# Patient Record
Sex: Female | Born: 1970 | Hispanic: No | Marital: Single | Smoking: Current every day smoker
Health system: Southern US, Community
[De-identification: ages and names within clinical notes are randomized; demographics above are authoritative.]

## PROBLEM LIST (undated history)

## (undated) ENCOUNTER — Inpatient Hospital Stay (HOSPITAL_COMMUNITY): Payer: Self-pay

## (undated) DIAGNOSIS — O039 Complete or unspecified spontaneous abortion without complication: Secondary | ICD-10-CM

## (undated) DIAGNOSIS — Z8489 Family history of other specified conditions: Secondary | ICD-10-CM

## (undated) DIAGNOSIS — Z789 Other specified health status: Secondary | ICD-10-CM

## (undated) HISTORY — DX: Complete or unspecified spontaneous abortion without complication: O03.9

## (undated) HISTORY — PX: DILATION AND CURETTAGE OF UTERUS: SHX78

---

## 2000-08-02 ENCOUNTER — Other Ambulatory Visit: Admission: RE | Admit: 2000-08-02 | Discharge: 2000-08-02 | Payer: Self-pay | Admitting: Gynecology

## 2001-03-20 ENCOUNTER — Other Ambulatory Visit: Admission: RE | Admit: 2001-03-20 | Discharge: 2001-03-20 | Payer: Self-pay | Admitting: Obstetrics and Gynecology

## 2001-09-20 ENCOUNTER — Encounter: Admission: RE | Admit: 2001-09-20 | Discharge: 2001-09-20 | Payer: Self-pay | Admitting: Obstetrics and Gynecology

## 2001-10-01 ENCOUNTER — Inpatient Hospital Stay (HOSPITAL_COMMUNITY): Admission: AD | Admit: 2001-10-01 | Discharge: 2001-10-04 | Payer: Self-pay | Admitting: Obstetrics and Gynecology

## 2005-08-08 ENCOUNTER — Other Ambulatory Visit: Admission: RE | Admit: 2005-08-08 | Discharge: 2005-08-08 | Payer: Self-pay | Admitting: Obstetrics & Gynecology

## 2012-12-13 ENCOUNTER — Telehealth: Payer: Self-pay | Admitting: Family Medicine

## 2012-12-13 ENCOUNTER — Encounter: Payer: Self-pay | Admitting: Obstetrics and Gynecology

## 2012-12-13 NOTE — Telephone Encounter (Signed)
Called patient about her appointment being changed, but her phone has been turned off.

## 2013-01-02 ENCOUNTER — Encounter: Payer: Self-pay | Admitting: Family Medicine

## 2013-12-09 ENCOUNTER — Encounter (HOSPITAL_COMMUNITY): Payer: Self-pay | Admitting: Emergency Medicine

## 2013-12-09 DIAGNOSIS — Z87891 Personal history of nicotine dependence: Secondary | ICD-10-CM | POA: Insufficient documentation

## 2013-12-09 DIAGNOSIS — O9989 Other specified diseases and conditions complicating pregnancy, childbirth and the puerperium: Secondary | ICD-10-CM | POA: Insufficient documentation

## 2013-12-09 DIAGNOSIS — R109 Unspecified abdominal pain: Secondary | ICD-10-CM | POA: Diagnosis present

## 2013-12-09 LAB — CBC
HEMATOCRIT: 39.6 % (ref 36.0–46.0)
Hemoglobin: 13.3 g/dL (ref 12.0–15.0)
MCH: 31.7 pg (ref 26.0–34.0)
MCHC: 33.6 g/dL (ref 30.0–36.0)
MCV: 94.3 fL (ref 78.0–100.0)
PLATELETS: 256 10*3/uL (ref 150–400)
RBC: 4.2 MIL/uL (ref 3.87–5.11)
RDW: 13.2 % (ref 11.5–15.5)
WBC: 8.3 10*3/uL (ref 4.0–10.5)

## 2013-12-09 LAB — ABO/RH: ABO/RH(D): O POS

## 2013-12-09 LAB — HCG, QUANTITATIVE, PREGNANCY: hCG, Beta Chain, Quant, S: 2833 m[IU]/mL — ABNORMAL HIGH (ref <5)

## 2013-12-09 NOTE — ED Notes (Signed)
Pt reports constant 8/10 "sharp" lower abdominal pain starting at 1000 today. States she is [redacted] weeks pregnant. Pt denies vaginal bleeding/discharge. Denies N/V/D. Pt denies urinary symptoms. NAD. AO x4.

## 2013-12-10 ENCOUNTER — Emergency Department (HOSPITAL_COMMUNITY): Payer: Medicaid Other

## 2013-12-10 ENCOUNTER — Emergency Department (HOSPITAL_COMMUNITY)
Admission: EM | Admit: 2013-12-10 | Discharge: 2013-12-10 | Disposition: A | Discharge status: Home / Self Care | Payer: Medicaid Other | Attending: Emergency Medicine | Admitting: Emergency Medicine

## 2013-12-10 DIAGNOSIS — R102 Pelvic and perineal pain: Secondary | ICD-10-CM

## 2013-12-10 DIAGNOSIS — O26899 Other specified pregnancy related conditions, unspecified trimester: Secondary | ICD-10-CM

## 2013-12-10 DIAGNOSIS — Z3490 Encounter for supervision of normal pregnancy, unspecified, unspecified trimester: Secondary | ICD-10-CM

## 2013-12-10 LAB — URINALYSIS, ROUTINE W REFLEX MICROSCOPIC
BILIRUBIN URINE: NEGATIVE
Glucose, UA: NEGATIVE mg/dL
Hgb urine dipstick: NEGATIVE
KETONES UR: NEGATIVE mg/dL
Leukocytes, UA: NEGATIVE
NITRITE: NEGATIVE
PH: 5.5 (ref 5.0–8.0)
Protein, ur: NEGATIVE mg/dL
Specific Gravity, Urine: 1.029 (ref 1.005–1.030)
Urobilinogen, UA: 0.2 mg/dL (ref 0.0–1.0)

## 2013-12-10 LAB — RPR

## 2013-12-10 LAB — WET PREP, GENITAL
CLUE CELLS WET PREP: NONE SEEN
TRICH WET PREP: NONE SEEN
YEAST WET PREP: NONE SEEN

## 2013-12-10 LAB — POC URINE PREG, ED: PREG TEST UR: POSITIVE — AB

## 2013-12-10 NOTE — Discharge Instructions (Signed)
Your testing shows a normal pregnancy around [redacted] weeks pregnant - you MUST follow up for repeat ultrasound and blood test in 2 weeks - see the phone number above.  Tylenol for pain - do not take motrin or other over the counter medicines.

## 2013-12-10 NOTE — ED Provider Notes (Signed)
CSN: [id number]     Arrival date & time 12/09/13  2211 History   First MD Initiated Contact with Patient 12/10/13 [phone]     Chief Complaint  Patient presents with  . Abdominal Pain     (Consider location/radiation/quality/duration/timing/severity/associated sxs/prior Treatment) HPI Comments: The patient is a 43 year old female who presents with a complaint of left pelvic pain. This started abruptly at 6:00 this evening, it has been persistent, worse with palpation of the abdomen and not associated with nausea vomiting vaginal discharge or bleeding or dysuria or constipation or diarrhea. She reports that she is [redacted] weeks pregnant by dates, she has had a pregnancy test at her doctor's office but has not seen a gynecologist. She has had 2 other pregnancies, both of them were uncomplicated term deliveries. She denies any other abdominal surgery, medical problems or allergies. She describes this pain as a sharp pain, no radiation to the back  Patient is a 43 y.o. female presenting with abdominal pain. The history is provided by the patient.  Abdominal Pain   History reviewed. No pertinent past medical history. History reviewed. No pertinent past surgical history. No family history on file. History  Substance Use Topics  . Smoking status: Former Games developer  . Smokeless tobacco: Not on file  . Alcohol Use: No   OB History   Grav Para Term Preterm Abortions TAB SAB Ect Mult Living   3 2 2   0  0 0  2     Review of Systems  Gastrointestinal: Positive for abdominal pain.  All other systems reviewed and are negative.     Allergies  Review of patient's allergies indicates no known allergies.  Home Medications   Prior to Admission medications   Not on File   BP 118/67  Pulse 63  Temp(Src) 98.1 F (36.7 C) (Oral)  Resp 18  Wt 127 lb (57.607 kg)  SpO2 100%  LMP 10/31/2013 Physical Exam  Nursing note and vitals reviewed. Constitutional: She appears well-developed and well-nourished. No  distress.  HENT:  Head: Normocephalic and atraumatic.  Mouth/Throat: Oropharynx is clear and moist. No oropharyngeal exudate.  Eyes: Conjunctivae and EOM are normal. Pupils are equal, round, and reactive to light. Right eye exhibits no discharge. Left eye exhibits no discharge. No scleral icterus.  Neck: Normal range of motion. Neck supple. No JVD present. No thyromegaly present.  Cardiovascular: Normal rate, regular rhythm, normal heart sounds and intact distal pulses.  Exam reveals no gallop and no friction rub.   No murmur heard. Pulmonary/Chest: Effort normal and breath sounds normal. No respiratory distress. She has no wheezes. She has no rales.  Abdominal: Soft. Bowel sounds are normal. She exhibits no distension and no mass. There is tenderness ( Left lower quadrant and pelvic tenderness to palpation over the abdominal wall, no other tenderness).  Genitourinary:  Is a Chaperone present for exam: No tenderness to palpation, cervical os closed, no discharge, no masses, normal external genitalia  Musculoskeletal: Normal range of motion. She exhibits no edema and no tenderness.  Lymphadenopathy:    She has no cervical adenopathy.  Neurological: She is alert. Coordination normal.  Skin: Skin is warm and dry. No rash noted. No erythema.  Psychiatric: She has a normal mood and affect. Her behavior is normal.    ED Course  Procedures (including critical care time) Labs Review Labs Reviewed  WET PREP, GENITAL - Abnormal; Notable for the following:    WBC, Wet Prep HPF POC FEW (*)    All  other components within normal limits  HCG, QUANTITATIVE, PREGNANCY - Abnormal; Notable for the following:    hCG, Beta Chain, Quant, S 2833 (*)    All other components within normal limits  URINALYSIS, ROUTINE W REFLEX MICROSCOPIC - Abnormal; Notable for the following:    APPearance CLOUDY (*)    All other components within normal limits  POC URINE PREG, ED - Abnormal; Notable for the following:     Preg Test, Ur POSITIVE (*)    All other components within normal limits  GC/CHLAMYDIA PROBE AMP  CBC  RPR  HIV ANTIBODY (ROUTINE TESTING)  ABO/RH    Imaging Review [place] Ob Comp Less 14 Wks  12/10/2013   CLINICAL DATA:  Pain and pregnancy.  Evaluate for ectopic  EXAM: OBSTETRIC <14 WK US AND TRANSVAGINAL OB US  TECHNIQUE: Both transabdominal and transvaginal ultrasound examinations were performed for complete evaluation of the gestation as well as the maternal uterus, adnexal regions, and pelvic cul-de-sac. Transvaginal technique was performed to assess early pregnancy.  COMPARISON:  None.  FINDINGS: Intrauterine gestational sac: Visualized/normal in shape.  Yolk sac:  Visualized  Embryo:  Not yet visualized  MSD:  6.8  mm   5 w   3  d              US EDC: 08/09/2014  Maternal uterus/adnexae: There is a 2.2 cm cyst within the right ovary which is likely follicular. No adnexal mass. Trace (2 mm) subchorionic fluid/hemorrhage.  IMPRESSION: Single intrauterine gestation, estimated age 4 weeks 3 days. As expected for gestational age, a fetus is not yet visible; follow-up quantitative B-HCG levels and US in 14 days could confirm viability.   Electronically Signed   By: Tiburcio PeaJonathan  Watts M.D.   On: 12/10/2013 03:04   [place] Ob Transvaginal  12/10/2013   CLINICAL DATA:  Pain and pregnancy.  Evaluate for ectopic  EXAM: OBSTETRIC <14 WK US AND TRANSVAGINAL OB US  TECHNIQUE: Both transabdominal and transvaginal ultrasound examinations were performed for complete evaluation of the gestation as well as the maternal uterus, adnexal regions, and pelvic cul-de-sac. Transvaginal technique was performed to assess early pregnancy.  COMPARISON:  None.  FINDINGS: Intrauterine gestational sac: Visualized/normal in shape.  Yolk sac:  Visualized  Embryo:  Not yet visualized  MSD:  6.8  mm   5 w   3  d              US EDC: 08/09/2014  Maternal uterus/adnexae: There is a 2.2 cm cyst within the right ovary which is likely follicular. No  adnexal mass. Trace (2 mm) subchorionic fluid/hemorrhage.  IMPRESSION: Single intrauterine gestation, estimated age 4 weeks 3 days. As expected for gestational age, a fetus is not yet visible; follow-up quantitative B-HCG levels and US in 14 days could confirm viability.   Electronically Signed   By: Tiburcio PeaJonathan  Watts M.D.   On: 12/10/2013 03:04      MDM   Final diagnoses:  Pregnancy  Pelvic pain affecting pregnancy    The patient has no tenderness, she is pregnant, rule out ectopic or infection or urinary tract infection. Vital signs normal, hemodynamically stable.  US with IUP at [redacted] weeks gestation, all other labs wnl, pt stable, informed, GYN f/u given.  Vida RollerBrian D Tarry Blayney, MD 12/10/13 [phone]

## 2013-12-10 NOTE — ED Notes (Signed)
To ultrasound

## 2013-12-10 NOTE — ED Notes (Signed)
Patient returned from ultrasound.

## 2013-12-11 LAB — GC/CHLAMYDIA PROBE AMP
CT Probe RNA: NEGATIVE
GC PROBE AMP APTIMA: NEGATIVE

## 2013-12-11 LAB — HIV ANTIBODY (ROUTINE TESTING W REFLEX): HIV 1&2 Ab, 4th Generation: NONREACTIVE

## 2014-01-14 ENCOUNTER — Encounter (HOSPITAL_COMMUNITY): Payer: Self-pay | Admitting: *Deleted

## 2014-01-14 ENCOUNTER — Inpatient Hospital Stay (HOSPITAL_COMMUNITY): Payer: Medicaid Other

## 2014-01-14 ENCOUNTER — Inpatient Hospital Stay (HOSPITAL_COMMUNITY)
Admission: AD | Admit: 2014-01-14 | Discharge: 2014-01-14 | Disposition: A | Discharge status: Home / Self Care | Payer: Medicaid Other | Source: Ambulatory Visit | Attending: Obstetrics & Gynecology | Admitting: Obstetrics & Gynecology

## 2014-01-14 DIAGNOSIS — O034 Incomplete spontaneous abortion without complication: Secondary | ICD-10-CM | POA: Diagnosis not present

## 2014-01-14 DIAGNOSIS — Z87891 Personal history of nicotine dependence: Secondary | ICD-10-CM | POA: Insufficient documentation

## 2014-01-14 DIAGNOSIS — R109 Unspecified abdominal pain: Secondary | ICD-10-CM | POA: Diagnosis present

## 2014-01-14 HISTORY — DX: Other specified health status: Z78.9

## 2014-01-14 LAB — POCT PREGNANCY, URINE: Preg Test, Ur: POSITIVE — AB

## 2014-01-14 LAB — CBC
HEMATOCRIT: 40.7 % (ref 36.0–46.0)
Hemoglobin: 14.1 g/dL (ref 12.0–15.0)
MCH: 32.6 pg (ref 26.0–34.0)
MCHC: 34.6 g/dL (ref 30.0–36.0)
MCV: 94.2 fL (ref 78.0–100.0)
Platelets: 213 10*3/uL (ref 150–400)
RBC: 4.32 MIL/uL (ref 3.87–5.11)
RDW: 13.2 % (ref 11.5–15.5)
WBC: 10.1 10*3/uL (ref 4.0–10.5)

## 2014-01-14 LAB — URINE MICROSCOPIC-ADD ON

## 2014-01-14 LAB — URINALYSIS, ROUTINE W REFLEX MICROSCOPIC
BILIRUBIN URINE: NEGATIVE
Glucose, UA: NEGATIVE mg/dL
Ketones, ur: NEGATIVE mg/dL
Leukocytes, UA: NEGATIVE
Nitrite: NEGATIVE
PROTEIN: NEGATIVE mg/dL
Specific Gravity, Urine: <1.005 — ABNORMAL LOW (ref 1.005–1.030)
Urobilinogen, UA: 0.2 mg/dL (ref 0.0–1.0)
pH: 6 (ref 5.0–8.0)

## 2014-01-14 LAB — HCG, QUANTITATIVE, PREGNANCY: hCG, Beta Chain, Quant, S: 3278 m[IU]/mL — ABNORMAL HIGH (ref <5)

## 2014-01-14 NOTE — Discharge Instructions (Signed)
Aborto incompleto °(Incomplete Miscarriage) °Un aborto espontáneo es la pérdida repentina de un bebé en gestación (feto) antes de la semana 20 del embarazo. En un aborto espontáneo, partes del feto o la placenta (alumbramiento) permanecen en el cuerpo.  °El aborto espontáneo puede ser una experiencia que afecte emocionalmente a la persona. Hable con su médico si tiene preguntas sobre el aborto espontáneo, el proceso de duelo y los planes futuros de embarazo. °CAUSAS  °· Algunos problemas cromosómicos pueden hacer imposible que el bebé se desarrolle normalmente. Los problemas con los genes o cromosomas del bebé son, en la mayoría de los casos, el resultado de errores que se producen, al azar, cuando el embrión se divide y crece. Estos problemas no se heredan de los padres. °· Infección en el cuello del útero. °· Problemas hormonales. °· Problemas en el cuello del útero, como tener un útero incompetente. Esto ocurre cuando los tejidos no son lo suficientemente fuertes como para contener el embarazo. °· Problemas del útero, como un útero con forma anormal, los fibromas o anormalidades congénitas. °· Ciertas enfermedades crónicas. °· No fume, no beba alcohol, ni consuma drogas. °· Traumatismos. °SÍNTOMAS  °· Sangrado o manchado vaginal, con o sin cólicos o dolor. °· Dolor o cólicos en el abdomen o en la cintura. °· Eliminación de líquido, tejidos o coágulos grandes por la vagina. °DIAGNÓSTICO  °El médico le hará un examen físico. También le indicará una ecografía para confirmar el aborto. Es posible que se realicen análisis de sangre. °TRATAMIENTO  °· Generalmente se realiza un procedimiento de dilatación y curetaje (D y C). Durante el procedimiento de dilatación y curetaje, el cuello del útero se abre (dilata) y se retira todo resto de tejido fetal o placentario del útero. °· Si hay una infección, le recetarán antibióticos. Posiblemente le receten otros medicamentos para reducir (contraer) el tamaño del útero si hay  mucha hemorragia. °· Si su tipo de sangre es Rh negativo y el del bebé es Rh positivo, necesitará una inyección de inmunoglobulina Rho(D). Esta inyección protegerá a los futuros bebés de tener problemas de compatibilidad Rh en futuros embarazos. °· Probablemente le indiquen reposo. Esto significa que debe quedarse en cama y levantarse únicamente para ir al baño. °INSTRUCCIONES PARA EL CUIDADO EN EL HOGAR  °· Haga reposo según las indicaciones del médico. °· Limite las actividades según las indicaciones del médico. Es posible que se le permita retomar las actividades livianas si no se le realizó un curetaje, pero necesitará tratamiento adicional. °· Lleve un registro de la cantidad de toallas sanitarias que usa por día. Observe cuán impregnadas (saturadas) están. Registre esta información. °· No  use tampones. °· No se haga duchas vaginales ni tenga relaciones sexuales hasta que el médico la autorice. °· Asista a todas las citas de seguimiento para una nueva evaluación y para continuar el tratamiento. °· Sólo tome medicamentos de venta libre o recetados para calmar el dolor, el malestar o bajar la fiebre, según las indicaciones de su médico. °· Tome los antibióticos como le indicó el médico. Asegúrese de que finaliza la prescripción completa aunque se sienta mejor. °SOLICITE ATENCIÓN MÉDICA DE INMEDIATO SI:  °· Siente calambres intensos en el estómago, en la espalda o en el abdomen. °· Le sube la fiebre sin motivo (asegúrese de registrar las cifras). °· Elimina coágulos grandes o tejidos (consérvelos para que el médico los analice). °· La hemorragia aumenta. °· Se siente mareada, débil o tiene episodios de desmayo. °ASEGÚRESE DE QUE:  °· Comprende estas instrucciones. °·   Controlará su afección. °· Recibirá ayuda de inmediato si no mejora o si empeora. °Document Released: 05/16/2005 Document Revised: 03/06/2013 °ExitCare® Patient Information ©2015 [employer]. This information is not intended to replace advice given  to you by your health care provider. Make sure you discuss any questions you have with your health care provider. ° °

## 2014-01-14 NOTE — MAU Provider Note (Signed)
Chief Complaint: Abdominal Pain and Vaginal Bleeding  SUBJECTIVE HPI: Jeanne Myers is a 43 y.o. [id number] at 10w 3d by previous ultrasound estimation on 12/10/13 who presents with abdominal pain and vaginal bleeding.  Reports sharp pain in lower abdomen worse on the left side that started yesterday and has worsened this morning.  It is rated at 8/10 in severity.  She also reports vaginal bleeding which started yesterday as coffee-grounds and today is bright red.  This morning she passed a large clot.  The volume this morning has been sufficient to soak one pad throughout the entire morning.  Patient endorses slight fatigue but denies chest pain, shortness of breath, weakness, fever, chills, vaginal discharge, dysuria.  Past Medical History  Diagnosis Date  . Medical history non-contributory    OB History  Gravida Para Term Preterm AB SAB TAB Ectopic Multiple Living  4 2 2  1 1   0  2    # Outcome Date GA Lbr Len/2nd Weight Sex Delivery Anes PTL Lv  4 CUR           3 SAB           2 TRM           1 TRM              Past Surgical History  Procedure Laterality Date  . Dilation and curettage of uterus     History   Social History  . Marital Status: Single    Spouse Name: N/A    Number of Children: N/A  . Years of Education: N/A   Occupational History  . Not on file.   Social History Main Topics  . Smoking status: Former Games developermoker  . Smokeless tobacco: Never Used     Comment: quit with preg  . Alcohol Use: No  . Drug Use: No  . Sexual Activity: Yes    Birth Control/ Protection: None   Other Topics Concern  . Not on file   Social History Narrative  . No narrative on file   No current facility-administered medications on file prior to encounter.   No current outpatient prescriptions on file prior to encounter.   No Known Allergies  ROS: Pertinent items in HPI  OBJECTIVE Blood pressure 111/69, pulse 63, temperature 98.2 F (36.8 C), temperature source Oral, resp. rate  18, height 5' 2.25" (1.581 m), weight 59.421 kg (131 lb), last menstrual period 10/31/2013. GENERAL: Well-developed, well-nourished female in no acute distress.  HEENT: Normocephalic HEART: normal rate RESP: normal effort ABDOMEN: Soft, tender in RLQ and LLQ with the LLQ being more tender.  No masses, distension, rebound tenderness.  BS active. EXTREMITIES: Nontender, no edema NEURO: Alert and oriented SPECULUM EXAM: Clotted blood in vault and cervix.  Physiologic discharge.  No evidence of products of conception or tissue.     LAB RESULTS Results for orders placed during the hospital encounter of 01/14/14 (from the past 24 hour(s))  URINALYSIS, ROUTINE W REFLEX MICROSCOPIC     Status: Abnormal   Collection Time    01/14/14  7:15 AM      Result Value Ref Range   Color, Urine YELLOW  YELLOW   APPearance CLEAR  CLEAR   Specific Gravity, Urine <1.005 (*) 1.005 - 1.030   pH 6.0  5.0 - 8.0   Glucose, UA NEGATIVE  NEGATIVE mg/dL   Hgb urine dipstick LARGE (*) NEGATIVE   Bilirubin Urine NEGATIVE  NEGATIVE   Ketones, ur NEGATIVE  NEGATIVE mg/dL  Protein, ur NEGATIVE  NEGATIVE mg/dL   Urobilinogen, UA 0.2  0.0 - 1.0 mg/dL   Nitrite NEGATIVE  NEGATIVE   Leukocytes, UA NEGATIVE  NEGATIVE  URINE MICROSCOPIC-ADD ON     Status: Abnormal   Collection Time    01/14/14  7:15 AM      Result Value Ref Range   Squamous Epithelial / LPF FEW (*) RARE   RBC / HPF 7-10  <3 RBC/hpf   Bacteria, UA RARE  RARE  POCT PREGNANCY, URINE     Status: Abnormal   Collection Time    01/14/14  7:20 AM      Result Value Ref Range   Preg Test, Ur POSITIVE (*) NEGATIVE  HCG, QUANTITATIVE, PREGNANCY     Status: Abnormal   Collection Time    01/14/14  8:12 AM      Result Value Ref Range   hCG, Beta Chain, Quant, S 3278 (*) <5 mIU/mL  CBC     Status: None   Collection Time    01/14/14  8:13 AM      Result Value Ref Range   WBC 10.1  4.0 - 10.5 K/uL   RBC 4.32  3.87 - 5.11 MIL/uL   Hemoglobin 14.1  12.0 -  15.0 g/dL   HCT 16.1  09.6 - 04.5 %   MCV 94.2  78.0 - 100.0 fL   MCH 32.6  26.0 - 34.0 pg   MCHC 34.6  30.0 - 36.0 g/dL   RDW 40.9  81.1 - 91.4 %   Platelets 213  150 - 400 K/uL    IMAGING   MDM Ultrasound shows evidence of SAB with potential retained products.  Patient's clinical picture is in alignment with these findings.  As os is open and clots/tissue is still being passed Cytotec not warranted at this point.  ASSESSMENT 1. Incomplete spontaneous abortion without mention of complication     PLAN Expectant management Precautions regarding increased bleeding and signs of hemodynamic instability given Discharge home Tylenol for discomfort Follow [facility] week in [facility]  Return to MAU for emergency     Follow-up Information   Follow up with [facility] In 2 weeks. (Follow up for SAB)        Medication List    ASK your doctor about these medications       prenatal multivitamin Tabs tablet  Take 1 tablet by mouth daily at 12 noon.        Emerson Monte, Student-PA 01/14/2014  10:31 AM  I have seen and evaluated the patient with the NP/PA/Med student. I agree with the assessment and plan as written above.   Bertram Denver, PA-C  01/14/2014 10:44 AM   Emerson Monte, Student-PA 01/14/2014  10:31 AM

## 2014-01-14 NOTE — MAU Note (Signed)
Pain started yesterday is constant, lower abd more on the left.bleeding started yesterday as coffee colored d/c- became bright red this morning- passed a large clot

## 2014-01-14 NOTE — Progress Notes (Signed)
I assisted Arts development officerJolynn RN with Medical History. Shed Nixon H Nicholous Girgenti Interpreter.

## 2014-01-21 ENCOUNTER — Other Ambulatory Visit: Payer: Medicaid Other

## 2014-01-21 DIAGNOSIS — N939 Abnormal uterine and vaginal bleeding, unspecified: Secondary | ICD-10-CM

## 2014-01-22 ENCOUNTER — Telehealth: Payer: Self-pay | Admitting: *Deleted

## 2014-01-22 LAB — HCG, QUANTITATIVE, PREGNANCY: HCG, BETA CHAIN, QUANT, S: 66.3 m[IU]/mL

## 2014-01-22 NOTE — Telephone Encounter (Addendum)
Message copied by Jill Side on Wed Jan 22, 2014  4:19 PM ------      Message from: Adam Phenix      Created: Wed Jan 22, 2014  3:54 PM       F/u 9/9 as scheduled  ------ Called pt with Beronica and left message that I am calling with test result information.  Please call back. Pt needs to be informed that her pregnancy hormone level has decreased as expected. She does not need to do anything else for now. She should keep her clinic appt on 9/9 @ 1545.

## 2014-01-23 NOTE — Telephone Encounter (Signed)
Called patient with spanish interpreter Jeanne Myers. Voicemail left for patient to call us back.

## 2014-01-27 NOTE — Telephone Encounter (Signed)
Attempted to call patient with pacific interpreter [phone]. No answer. Left message stating we are calling with results, please call clinic.

## 2014-01-28 ENCOUNTER — Encounter: Payer: Self-pay | Admitting: General Practice

## 2014-01-28 ENCOUNTER — Encounter: Payer: Self-pay | Admitting: *Deleted

## 2014-01-28 NOTE — Telephone Encounter (Signed)
Called patient with pacific interpreter [phone], no answer and left message that we are trying to reach you with results, please call us back at the clinics. Will send letter

## 2014-02-05 ENCOUNTER — Encounter: Payer: Self-pay | Admitting: Obstetrics & Gynecology

## 2014-02-05 ENCOUNTER — Ambulatory Visit (INDEPENDENT_AMBULATORY_CARE_PROVIDER_SITE_OTHER): Payer: Medicaid Other | Admitting: Obstetrics & Gynecology

## 2014-02-05 VITALS — BP 108/68 | HR 59 | Temp 97.5°F | Resp 20 | Wt 130.3 lb

## 2014-02-05 DIAGNOSIS — Z3049 Encounter for surveillance of other contraceptives: Secondary | ICD-10-CM

## 2014-02-05 DIAGNOSIS — O039 Complete or unspecified spontaneous abortion without complication: Secondary | ICD-10-CM

## 2014-02-05 MED ORDER — MEDROXYPROGESTERONE ACETATE 150 MG/ML IM SUSP
150.0000 mg | INTRAMUSCULAR | Status: AC
  Administered 2014-02-05: 150 mg via INTRAMUSCULAR

## 2014-02-05 NOTE — Progress Notes (Signed)
Pt has had unprotected sex during the last 4 days.  Pt resumed smoking cigarettes 1 week ago.

## 2014-02-05 NOTE — Progress Notes (Signed)
Subjective:     Patient ID: Jeanne Myers, female   DOB: 05-Aug-1970, 43 y.o.   MRN: [id number]  HPI Pt an SAB 01/21/2014.  She reports that she was still bleeding at the time of her SAB.  She had unprotected intercourse 4 days prev.  She want a sterilization and reports that she does not desire any future pregnancies.   Review of Systems     Objective:   Physical Exam BP 108/68  Pulse 59  Temp(Src) 97.5 F (36.4 C) (Oral)  Resp 20  Wt 130 lb 4.8 oz (59.104 kg)  LMP 10/31/2013  Breastfeeding? Unknown Pt in NAD Exam deferred     Assessment:     S/p SAB Desires permanent contraception- d/w pt BTL with clips vs salpingectomy.  She desires definitive treatment and prophylaxis with bilateral salpingectomy      Plan:     title XIX papers signed today Depo provera  IM x 1 now Patient desires surgical management with bilateral salpingectomy for permanent sterilization. The risks of surgery were discussed in detail with the patient including but not limited to: bleeding which may require transfusion or reoperation; infection which may require prolonged hospitalization or re-hospitalization and antibiotic therapy; injury to bowel, bladder, ureters and major vessels or other surrounding organs; need for additional procedures including laparotomy; thromboembolic phenomenon, incisional problems and other postoperative or anesthesia complications.  Patient was told that the likelihood that her condition and symptoms will be treated effectively with this surgical management was very high; the postoperative expectations were also discussed in detail. The patient also understands the alternative treatment options which were discussed in full. All questions were answered.  She was told that she will be contacted by our surgical scheduler regarding the time and date of her surgery; routine preoperative instructions of having nothing to eat or drink after midnight on the day prior to surgery and  also coming to the hospital 1 1/2 hours prior to her time of surgery were also emphasized.  She was told she may be called for a preoperative appointment about a week prior to surgery and will be given further preoperative instructions at that visit. Printed patient education handouts about the procedure were given to the patient to review at home.  The full history was done with the assistance of an Spanish interpreter.

## 2014-02-05 NOTE — Progress Notes (Signed)
BTL paper signed. Copy given to patient. Original placed in folder to be scanned.

## 2014-02-05 NOTE — Patient Instructions (Signed)
Informacin sobre Engineer, civil (consulting)la esterilizacin en las mujeres  (Sterilization Information, Female) La esterilizacin en la mujer es un procedimiento que se realiza para Location managerevitar el embarazo de [place]manera permanente. Hay diferentes formas de Futures traderrealizar la esterilizacin, Biomedical engineerpero en todos los casos se bloquean o se cierran las trompas de [place] para que vulos no puedan llegar al tero. Si el vulo no llega al tero, los espermatozoides no fertilizan el vulo, y usted no podr Burundiquedar embarazada.  La esterilizacin se lleva a cabo por medio de un procedimiento quirrgico. A veces, estos procedimientos se realizan en el hospital haciendo dormir a Education officer, communityla paciente. En otros casos se realiza en un consultorio en una clnica y la paciente permanece despierta. Las trompas de [place] se pueden cortar, Public affairs consultantligar o sellar quirrgicamente, con un procedimiento que se llama ligadura de trompas. Otro mtodo es cerrarlas con clips o anillos. La esterilizacin tambin puede realizarse mediante la colocacin de un pequeo resorte en cada trompa de [place], lo que hace que se desarrolle tejido cicatrizal en el interior de la trompa. Luego el tejido Verizoncicatrizal obstruye las trompas.   Comente el tema con su mdico para responder las inquietudes que usted o su pareja puedan Warehouse managertener. Podr preguntarle a su mdico qu tipo de Diplomatic Services operational officeresterilizacin se realizar. Algunos profesionales pueden no realizar todas las opciones. La esterilizacin es permanente y slo debe hacerse si est segura de que no desea tener hijos o no desea tener ms hijos. La reversin de la esterilizacin puede no tener xito.  PROCEDIMIENTOS PARA LA ESTERILIZACIN   Esterilizacin laparoscpica. Es un mtodo quirrgico que se Biomedical engineerrealiza en otro momento que no es inmediatamente despus del [place]parto. Se realizan dos incisiones en la zona baja del abdomen. Un tubo delgado con una fuente de luz (laparoscopio) se inserta en una de las incisiones y se [place] ([place]) Islandsutiliza para Surveyor, quantityrealizar el procedimiento. Las trompas de  [place] se cierran con un anillo o un clip. Podrn utilizar un instrumento que utiliza el calor para sellar las trompas (electrocauterizacin).  Mini-laparotoma. Es un mtodo quirrgico que se Biomedical engineerrealiza 1 o 2 das despus del [place]. En general, consiste en una pequea incisin que se hace justo debajo del (ombligo) por el cual se visualizan las trompas de [place]. Las trompas pueden ser selladas, atadas o cortadas.   Esterilizacin histeroscpica. Esto se realiza en otro momento que no sea inmediatamente despus del parto. Se coloca un pequeo resorte helicoidal a travs del cuello y del cuerpo del tero y se inserta en las trompas de [place]. El resorte produce cicatrizacin y Thrivent Financialobstruye las trompas. Se deben utilizar otras formas de anticoncepcin durante 3 meses despus del procedimiento para permitir que el tejido cicatrizal se forme completamente. Adems, es necesario hacer una histerosalpingografa despus de 3 meses para asegurarse de que el procedimiento ha sido exitoso.  La histerosalpingografa es un procedimiento en el que utilizan rayos X para observar el tero y las trompas de [place] despus de insertar un dispositivo, para asegurarse de que est bien colocado. LA ESTERILIZACIN ES UN PROCEDIMIENTO SEGURO?  La esterilizacin se considera un procedimiento seguro en el que rara vez se producen complicaciones. Los riesgos dependen del tipo de procedimiento que se realicen. Al igual que con cualquier procedimiento quirrgico, puede haber riesgos. Algunos son:   Heron NaySangrado.  Infeccin.  Reaccin a la anestesia.  Lesin en los rganos circundantes. Los riesgos especficos de la colocacin de los resortes por histeroscopa son:   No se Production designer, theatre/television/filmpueden colocar correctamente la primera vez.   Las resortes pueden salirse del Environmental consultantlugar.  Las trompas no quedan completamente bloqueadas despus de 3 meses.   Ocurre una lesin en los rganos adyacentes al colocarlo.  LA ESTERILIZACIN ES UN MTODO  EFECTIVO?  La esterilizacin es efectiva en casi 100% , pero puede fallar. Segn el tipo de esterilizacin, el porcentaje de fracaso puede ser tan alto como en un 3%. Despus de la esterilizacin histeroscpica con colocacin de un resorte en las trompas de [place], Pension scheme manager un mtodo anticonceptivo de respaldo durante 3 meses. La esterilizacin es efectiva durante toda la vida.  LOS BENEFICIOS DE LA ESTERILIZACIN   No afecta las hormonas, y por lo tanto no afectar sus perodos menstruales, el deseo o el rendimiento sexual, .   Es efectiva para toda la vida.   Es segura.   No tiene que preocuparse por Location manager. Tenga en cuenta que si fue sometida a este procedimiento, debe esperar 3 meses (o hasta que el mdico lo confirme) antes de considerar que no quedar embarazada.   No hay efectos secundarios a diferencia de otros tipos de control de la natalidad (contracepcin).  INCONVENIENTES DE LA ESTERILIZACIN   Usted debe estar seguro de que no desea tener hijos o ms hijos. El procedimiento es [place].   No ofrece proteccin contra las infecciones de transmisin sexual (ITS).   Las trompas [employer] a Engineer, building services. Si esto ocurre, habr riesgo de embarazo. Tambin hay un mayor riesgo (50%) que el embarazo sea ectpico. Se llama as al embarazo que ocurre fuera del tero. Document Released: 11/02/2007 Document Revised: 05/21/2013 [facility] Patient Information 2015 [place], [place]. This information is not intended to replace advice given to you by your health care provider. Make sure you discuss any questions you have with your health care provider.

## 2014-02-10 ENCOUNTER — Encounter: Payer: Self-pay | Admitting: *Deleted

## 2014-03-25 ENCOUNTER — Encounter (HOSPITAL_COMMUNITY): Payer: Self-pay | Admitting: Anesthesiology

## 2014-03-25 ENCOUNTER — Encounter (HOSPITAL_COMMUNITY): Admission: RE | Payer: Self-pay | Source: Ambulatory Visit

## 2014-03-25 ENCOUNTER — Ambulatory Visit (HOSPITAL_COMMUNITY)
Admission: RE | Admit: 2014-03-25 | Payer: Medicaid Other | Source: Ambulatory Visit | Admitting: Obstetrics & Gynecology

## 2014-03-25 SURGERY — SALPINGECTOMY, BILATERAL, LAPAROSCOPIC
Anesthesia: Choice | Site: Abdomen | Laterality: Bilateral

## 2014-03-25 MED ORDER — ONDANSETRON HCL 4 MG/2ML IJ SOLN
INTRAMUSCULAR | Status: AC
  Filled 2014-03-25: qty 2

## 2014-03-25 MED ORDER — DEXAMETHASONE SODIUM PHOSPHATE 4 MG/ML IJ SOLN
INTRAMUSCULAR | Status: AC
  Filled 2014-03-25: qty 1

## 2014-03-25 MED ORDER — NEOSTIGMINE METHYLSULFATE 10 MG/10ML IV SOLN
INTRAVENOUS | Status: AC
  Filled 2014-03-25: qty 1

## 2014-03-25 MED ORDER — MIDAZOLAM HCL 2 MG/2ML IJ SOLN
INTRAMUSCULAR | Status: AC
  Filled 2014-03-25: qty 2

## 2014-03-25 MED ORDER — FENTANYL CITRATE 0.05 MG/ML IJ SOLN
INTRAMUSCULAR | Status: AC
  Filled 2014-03-25: qty 5

## 2014-03-25 MED ORDER — GLYCOPYRROLATE 0.2 MG/ML IJ SOLN
INTRAMUSCULAR | Status: AC
  Filled 2014-03-25: qty 3

## 2014-03-25 MED ORDER — KETOROLAC TROMETHAMINE 30 MG/ML IJ SOLN
INTRAMUSCULAR | Status: AC
  Filled 2014-03-25: qty 1

## 2014-03-25 MED ORDER — LIDOCAINE HCL (CARDIAC) 20 MG/ML IV SOLN
INTRAVENOUS | Status: AC
  Filled 2014-03-25: qty 5

## 2014-03-25 MED ORDER — PROPOFOL 10 MG/ML IV EMUL
INTRAVENOUS | Status: AC
  Filled 2014-03-25: qty 20

## 2014-03-31 ENCOUNTER — Encounter: Payer: Self-pay | Admitting: Obstetrics & Gynecology

## 2020-08-05 ENCOUNTER — Other Ambulatory Visit: Payer: Self-pay

## 2020-08-05 ENCOUNTER — Inpatient Hospital Stay (HOSPITAL_COMMUNITY)
Admission: EM | Admit: 2020-08-05 | Discharge: 2020-08-12 | DRG: 389 | Disposition: A | Discharge status: Home / Self Care | Payer: Medicaid Other | Attending: Internal Medicine | Admitting: Internal Medicine

## 2020-08-05 DIAGNOSIS — Z20822 Contact with and (suspected) exposure to covid-19: Secondary | ICD-10-CM | POA: Diagnosis present

## 2020-08-05 DIAGNOSIS — K5669 Other partial intestinal obstruction: Principal | ICD-10-CM | POA: Diagnosis present

## 2020-08-05 DIAGNOSIS — Z720 Tobacco use: Secondary | ICD-10-CM | POA: Diagnosis present

## 2020-08-05 DIAGNOSIS — K50012 Crohn's disease of small intestine with intestinal obstruction: Secondary | ICD-10-CM | POA: Diagnosis present

## 2020-08-05 DIAGNOSIS — Z833 Family history of diabetes mellitus: Secondary | ICD-10-CM

## 2020-08-05 DIAGNOSIS — K56600 Partial intestinal obstruction, unspecified as to cause: Secondary | ICD-10-CM | POA: Diagnosis present

## 2020-08-05 DIAGNOSIS — F17210 Nicotine dependence, cigarettes, uncomplicated: Secondary | ICD-10-CM | POA: Diagnosis present

## 2020-08-05 DIAGNOSIS — Z8249 Family history of ischemic heart disease and other diseases of the circulatory system: Secondary | ICD-10-CM

## 2020-08-05 DIAGNOSIS — K635 Polyp of colon: Secondary | ICD-10-CM | POA: Diagnosis present

## 2020-08-05 DIAGNOSIS — K56609 Unspecified intestinal obstruction, unspecified as to partial versus complete obstruction: Secondary | ICD-10-CM | POA: Diagnosis present

## 2020-08-05 HISTORY — DX: Family history of other specified conditions: Z84.89

## 2020-08-05 LAB — URINALYSIS, ROUTINE W REFLEX MICROSCOPIC
Bacteria, UA: NONE SEEN
Bilirubin Urine: NEGATIVE
Glucose, UA: NEGATIVE mg/dL
Ketones, ur: 80 mg/dL — AB
Leukocytes,Ua: NEGATIVE
Nitrite: NEGATIVE
Protein, ur: 30 mg/dL — AB
Specific Gravity, Urine: 1.031 — ABNORMAL HIGH (ref 1.005–1.030)
pH: 5 (ref 5.0–8.0)

## 2020-08-05 LAB — CBC
HCT: 48.2 % — ABNORMAL HIGH (ref 36.0–46.0)
Hemoglobin: 16 g/dL — ABNORMAL HIGH (ref 12.0–15.0)
MCH: 31.3 pg (ref 26.0–34.0)
MCHC: 33.2 g/dL (ref 30.0–36.0)
MCV: 94.3 fL (ref 80.0–100.0)
Platelets: 327 10*3/uL (ref 150–400)
RBC: 5.11 MIL/uL (ref 3.87–5.11)
RDW: 12.8 % (ref 11.5–15.5)
WBC: 10.8 10*3/uL — ABNORMAL HIGH (ref 4.0–10.5)
nRBC: 0 % (ref 0.0–0.2)

## 2020-08-05 LAB — I-STAT BETA HCG BLOOD, ED (MC, WL, AP ONLY): I-stat hCG, quantitative: <5 m[IU]/mL (ref <5)

## 2020-08-05 NOTE — ED Triage Notes (Signed)
Pt c/o generalized abdominal pain. States urine is "a little brown". Last BM was yesterday.

## 2020-08-06 ENCOUNTER — Emergency Department (HOSPITAL_COMMUNITY): Payer: Medicaid Other

## 2020-08-06 ENCOUNTER — Encounter (HOSPITAL_COMMUNITY): Payer: Self-pay | Admitting: Family Medicine

## 2020-08-06 DIAGNOSIS — Z720 Tobacco use: Secondary | ICD-10-CM | POA: Diagnosis not present

## 2020-08-06 DIAGNOSIS — K56600 Partial intestinal obstruction, unspecified as to cause: Secondary | ICD-10-CM | POA: Diagnosis not present

## 2020-08-06 LAB — LIPASE, BLOOD: Lipase: 25 U/L (ref 11–51)

## 2020-08-06 LAB — COMPREHENSIVE METABOLIC PANEL
ALT: 13 U/L (ref 0–44)
AST: 15 U/L (ref 15–41)
Albumin: 3.7 g/dL (ref 3.5–5.0)
Alkaline Phosphatase: 54 U/L (ref 38–126)
Anion gap: 8 (ref 5–15)
BUN: 9 mg/dL (ref 6–20)
CO2: 24 mmol/L (ref 22–32)
Calcium: 9.3 mg/dL (ref 8.9–10.3)
Chloride: 103 mmol/L (ref 98–111)
Creatinine, Ser: 0.76 mg/dL (ref 0.44–1.00)
GFR, Estimated: >60 mL/min (ref >60)
Glucose, Bld: 96 mg/dL (ref 70–99)
Potassium: 3.7 mmol/L (ref 3.5–5.1)
Sodium: 135 mmol/L (ref 135–145)
Total Bilirubin: 0.8 mg/dL (ref 0.3–1.2)
Total Protein: 6.4 g/dL — ABNORMAL LOW (ref 6.5–8.1)

## 2020-08-06 LAB — RESP PANEL BY RT-PCR (FLU A&B, COVID) ARPGX2
Influenza A by PCR: NEGATIVE
Influenza B by PCR: NEGATIVE
SARS Coronavirus 2 by RT PCR: NEGATIVE

## 2020-08-06 LAB — FERRITIN: Ferritin: 21 ng/mL (ref 11–307)

## 2020-08-06 LAB — VITAMIN D 25 HYDROXY (VIT D DEFICIENCY, FRACTURES): Vit D, 25-Hydroxy: 20.85 ng/mL — ABNORMAL LOW (ref 30–100)

## 2020-08-06 LAB — VITAMIN B12: Vitamin B-12: 203 pg/mL (ref 180–914)

## 2020-08-06 LAB — HIV ANTIBODY (ROUTINE TESTING W REFLEX): HIV Screen 4th Generation wRfx: NONREACTIVE

## 2020-08-06 LAB — IRON AND TIBC
Iron: 103 ug/dL (ref 28–170)
Saturation Ratios: 35 % — ABNORMAL HIGH (ref 10.4–31.8)
TIBC: 291 ug/dL (ref 250–450)
UIBC: 188 ug/dL

## 2020-08-06 LAB — C-REACTIVE PROTEIN: CRP: 0.5 mg/dL (ref <1.0)

## 2020-08-06 MED ORDER — NICOTINE 14 MG/24HR TD PT24
14.0000 mg | MEDICATED_PATCH | Freq: Every day | TRANSDERMAL | Status: DC
  Administered 2020-08-06 – 2020-08-12 (×7): 14 mg via TRANSDERMAL
  Filled 2020-08-06 (×7): qty 1

## 2020-08-06 MED ORDER — SODIUM CHLORIDE 0.9 % IV BOLUS
1000.0000 mL | Freq: Once | INTRAVENOUS | Status: AC
  Administered 2020-08-06: 1000 mL via INTRAVENOUS

## 2020-08-06 MED ORDER — ONDANSETRON HCL 4 MG/2ML IJ SOLN
4.0000 mg | Freq: Once | INTRAMUSCULAR | Status: AC
  Administered 2020-08-06: 4 mg via INTRAVENOUS
  Filled 2020-08-06: qty 2

## 2020-08-06 MED ORDER — ONDANSETRON HCL 4 MG PO TABS: 4.0000 mg | ORAL_TABLET | Freq: Four times a day (QID) | ORAL | Status: DC | PRN

## 2020-08-06 MED ORDER — LACTATED RINGERS IV SOLN: INTRAVENOUS | Status: AC

## 2020-08-06 MED ORDER — HYDROMORPHONE HCL 1 MG/ML IJ SOLN
1.0000 mg | Freq: Once | INTRAMUSCULAR | Status: AC
  Administered 2020-08-06: 1 mg via INTRAVENOUS
  Filled 2020-08-06: qty 1

## 2020-08-06 MED ORDER — METHYLPREDNISOLONE SODIUM SUCC 40 MG IJ SOLR
40.0000 mg | Freq: Two times a day (BID) | INTRAMUSCULAR | Status: DC
  Administered 2020-08-06 – 2020-08-08 (×4): 40 mg via INTRAVENOUS
  Filled 2020-08-06 (×4): qty 1

## 2020-08-06 MED ORDER — HYDROMORPHONE HCL 1 MG/ML IJ SOLN
0.5000 mg | INTRAMUSCULAR | Status: DC | PRN
  Administered 2020-08-06 – 2020-08-11 (×15): 1 mg via INTRAVENOUS
  Filled 2020-08-06 (×20): qty 1

## 2020-08-06 MED ORDER — ONDANSETRON HCL 4 MG/2ML IJ SOLN
4.0000 mg | Freq: Four times a day (QID) | INTRAMUSCULAR | Status: DC | PRN
  Administered 2020-08-08: 4 mg via INTRAVENOUS
  Filled 2020-08-06: qty 2

## 2020-08-06 MED ORDER — IOHEXOL 300 MG/ML  SOLN
100.0000 mL | Freq: Once | INTRAMUSCULAR | Status: AC | PRN
  Administered 2020-08-06: 100 mL via INTRAVENOUS

## 2020-08-06 NOTE — ED Notes (Signed)
Patient transported to CT 

## 2020-08-06 NOTE — Progress Notes (Signed)
Patient admitted to unit around 1300; ambulated with supervision to bathroom, able to void. Tenderness to abdomen, pain management with IV dilaudid. Continues to be NPO with ice chips.

## 2020-08-06 NOTE — Consult Note (Signed)
UNASSIGNED CONSULT  Reason for Consult: TI stricture Referring Physician: [facility]  Deamber T Purnell Shoemaker HPI: This is a 50 year old female without any PMH admitted for RLQ pain.  Her pain started one day ago and it progressively worsened.  This is the first time that she suffered with this pain and denies any prior abdominal complaints in the past.  She denies any issues with hematochezia, melena, diarrhea, or constipation.  CT scan of the ABD/pelvis shows that the his a TI stricture that is focal at the ICV as well as some evidence of inflammation in the TI.  Past Medical History:  Diagnosis Date  . Medical history non-contributory   . Miscarriage     Past Surgical History:  Procedure Laterality Date  . DILATION AND CURETTAGE OF UTERUS      Family History  Problem Relation Age of Onset  . Diabetes Mother   . Heart disease Mother   . Hypertension Mother   . Heart disease Father     Social History:  reports that she has been smoking. She has a 6.75 pack-year smoking history. She has never used smokeless tobacco. She reports that she does not drink alcohol and does not use drugs.  Allergies: No Known Allergies  Medications:  Scheduled: . medroxyPROGESTERone  150 mg Intramuscular Q90 days  . nicotine  14 mg Transdermal Daily   Continuous: . lactated ringers 110 mL/hr at 08/06/20 0753    Results for orders placed or performed during the hospital encounter of 08/05/20 (from the past 24 hour(s))  Urinalysis, Routine w reflex microscopic Urine, Clean Catch     Status: Abnormal   Collection Time: 08/05/20 11:12 PM  Result Value Ref Range   Color, Urine AMBER (A) YELLOW   APPearance HAZY (A) CLEAR   Specific Gravity, Urine 1.031 (H) 1.005 - 1.030   pH 5.0 5.0 - 8.0   Glucose, UA NEGATIVE NEGATIVE mg/dL   Hgb urine dipstick MODERATE (A) NEGATIVE   Bilirubin Urine NEGATIVE NEGATIVE   Ketones, ur 80 (A) NEGATIVE mg/dL   Protein, ur 30 (A) NEGATIVE mg/dL   Nitrite  NEGATIVE NEGATIVE   Leukocytes,Ua NEGATIVE NEGATIVE   RBC / HPF 21-50 0 - 5 RBC/hpf   WBC, UA 0-5 0 - 5 WBC/hpf   Bacteria, UA NONE SEEN NONE SEEN   Squamous Epithelial / LPF 6-10 0 - 5   Mucus PRESENT   Lipase, blood     Status: None   Collection Time: 08/05/20 11:17 PM  Result Value Ref Range   Lipase 25 11 - 51 U/L  Comprehensive metabolic panel     Status: Abnormal   Collection Time: 08/05/20 11:17 PM  Result Value Ref Range   Sodium 135 135 - 145 mmol/L   Potassium 3.7 3.5 - 5.1 mmol/L   Chloride 103 98 - 111 mmol/L   CO2 24 22 - 32 mmol/L   Glucose, Bld 96 70 - 99 mg/dL   BUN 9 6 - 20 mg/dL   Creatinine, Ser 2.13 0.44 - 1.00 mg/dL   Calcium 9.3 8.9 - 08.6 mg/dL   Total Protein 6.4 (L) 6.5 - 8.1 g/dL   Albumin 3.7 3.5 - 5.0 g/dL   AST 15 15 - 41 U/L   ALT 13 0 - 44 U/L   Alkaline Phosphatase 54 38 - 126 U/L   Total Bilirubin 0.8 0.3 - 1.2 mg/dL   GFR, Estimated >57 >84 mL/min   Anion gap 8 5 - 15  CBC  Status: Abnormal   Collection Time: 08/05/20 11:17 PM  Result Value Ref Range   WBC 10.8 (H) 4.0 - 10.5 K/uL   RBC 5.11 3.87 - 5.11 MIL/uL   Hemoglobin 16.0 (H) 12.0 - 15.0 g/dL   HCT 29.9 (H) 24.2 - 68.3 %   MCV 94.3 80.0 - 100.0 fL   MCH 31.3 26.0 - 34.0 pg   MCHC 33.2 30.0 - 36.0 g/dL   RDW 41.9 62.2 - 29.7 %   Platelets 327 150 - 400 K/uL   nRBC 0.0 0.0 - 0.2 %  I-Stat beta hCG blood, ED     Status: None   Collection Time: 08/05/20 11:26 PM  Result Value Ref Range   I-stat hCG, quantitative <5.0 <5 mIU/mL   Comment 3          Resp Panel by RT-PCR (Flu A&B, Covid) Nasopharyngeal Swab     Status: None   Collection Time: 08/06/20  5:15 AM   Specimen: Nasopharyngeal Swab; Nasopharyngeal(NP) swabs in vial transport medium  Result Value Ref Range   SARS Coronavirus 2 by RT PCR NEGATIVE NEGATIVE   Influenza A by PCR NEGATIVE NEGATIVE   Influenza B by PCR NEGATIVE NEGATIVE     CT ABDOMEN PELVIS W CONTRAST  Result Date: 08/06/2020 CLINICAL DATA:  Diffuse  abdominal pain, hematuria EXAM: CT ABDOMEN AND PELVIS WITH CONTRAST TECHNIQUE: Multidetector CT imaging of the abdomen and pelvis was performed using the standard protocol following bolus administration of intravenous contrast. CONTRAST:  OMNIPAQUE IOHEXOL 300 MG/ML  SOLN COMPARISON:  None. FINDINGS: Lower chest: Visualized lung bases are clear bilaterally. The visualized heart and pericardium are unremarkable. Hepatobiliary: Scattered hypodensities are seen throughout the right hepatic lobe which are too small to accurately characterize but likely represent multiple small cysts or hemangioma in a patient without a history of malignancy. The liver is otherwise unremarkable. No intra or extrahepatic biliary ductal dilation. Gallbladder unremarkable. Pancreas: Unremarkable Spleen: Unremarkable Adrenals/Urinary Tract: The adrenal glands are unremarkable. The kidneys are normal in size and position. Simple cortical cyst noted within the interpolar region of the right kidney. The kidneys are otherwise unremarkable. Bladder unremarkable. Stomach/Bowel: There is inflammatory change involving the terminal ileum with mucosal hyperemia, mild surrounding mesenteric infiltration, and a a focal stricture identified roughly 5 cm proximal to the ileocecal junction resulting in a partial small bowel obstruction. Additionally, there is a separate region of hyperemia and bowel wall thickening seen approximately within the ileum, best noted on coronal image # 22/5. More proximally, the small bowel appears fluid-filled with fecalized intraluminal contents. Together, these geographically separate regions of inflammation with stricture ring noted in the region of the ileocecal junction are compatible with Crohn's disease. Less likely, this can be seen with atypical infection, including mycobacterial ileitis. The stomach, small bowel, and large bowel are otherwise unremarkable. Appendix normal. Mild free fluid within the right lower  quadrant of the abdomen adjacent to the area of maximal inflammation. No free intraperitoneal gas. Vascular/Lymphatic: The abdominal vasculature is unremarkable. No pathologic adenopathy within the abdomen and pelvis. Reproductive: Uterus and bilateral adnexa are unremarkable. Other: Tiny fat containing umbilical hernia. Musculoskeletal: No acute bone abnormality. No lytic or blastic bone lesion. IMPRESSION: Multiple, geographically distinct areas of inflammation within the terminal ileum with focal stricturing noted just proximal to the ileocecal junction suggestive of inflammatory bowel disease/Crohn's enteritis. Resultant partial small bowel obstruction. No evidence of perforation. No intra-abdominal abscess formation. Electronically Signed   By: Helyn Numbers MD  On: 08/06/2020 04:53    ROS:  As stated above in the HPI otherwise negative.  Blood pressure 127/72, pulse (!) 52, temperature 98.7 F (37.1 C), temperature source Oral, resp. rate 17, height 5\' 3"  (1.6 m), weight 54.4 kg, SpO2 100 %, unknown if currently breastfeeding.    PE: Gen: NAD, Alert and Oriented HEENT:  [place]/AT, EOMI Neck: Supple, no LAD Lungs: CTA Bilaterally CV: RRR without M/G/R ABD: Soft, tender in the lower abdomen R>L, +BS Ext: No C/C/E  Assessment/Plan: 1) TI stricture. 2) TI inflammation. 3) Partial small bowel obstruction. 4) Severe RLQ abdominal pain.   There is a high suspicion of Crohn's disease, but it can be determined unless a colonoscopy is performed.  Her her current state of pain and the partial SBO a colonoscopy is not possible.  The best course of action is to treat with steroids and this may relieve the stricture.  Plan: 1) Solumedrol 40 mg IV q12 hours. 2) NPO. 3) Agree with pain medications. 4) If the pain markedly improves or resolves, a colonoscopy will need to be performed in the hospital.  Shanah Guimaraes D 08/06/2020, 10:12 AM

## 2020-08-06 NOTE — Progress Notes (Signed)
Same day note  Patient seen and examined at bedside.  Patient was admitted to the hospital for abdominal pain  At the time of my evaluation, patient complains of mild abdominal pain.  No nausea or vomiting  Physical examination reveals average built female, not in obvious distress, mild right lower quadrant pain and tenderness on palpation.  Vitals with BMI 08/06/2020 08/06/2020 08/06/2020  Height - - -  Weight - - -  BMI - - -  Systolic 142 144 563  Diastolic 84 89 86  Pulse 50 50 54   Laboratory data and imaging was reviewed  Assessment and Plan.  Partial SBO, suspected Crohn disease  CT scan with inflammation of the terminal ileum with stricture.  GI has been consulted.  Concurrently IV fluids, n.p.o. antiemetics.  Follow GI recommendation.  Current smoker  -1/2 ppd smoker, wants to quit, nicotine patch provided    No Charge  Signed,  Tenny Craw, MD [facility]

## 2020-08-06 NOTE — ED Provider Notes (Signed)
[facility] EMERGENCY DEPARTMENT Provider Note   CSN: [id number] Arrival date & time: 08/05/20  2201     History Chief Complaint  Patient presents with  . Abdominal Pain    Jeanne Myers is a 50 y.o. female.  Patient presents to the emergency department with a chief complaint of abdominal pain.  She states symptoms began yesterday morning.  They have progressively worsened.  She states that the pain is all over.  She reports some episodes of nausea, but denies vomiting.  She denies any diarrhea.  States last bowel movement was yesterday.  She denies any dysuria or hematuria.  Denies any fevers chills.  Denies any prior abdominal surgeries.  She rates her pain as severe.  The history is provided by the patient. No language interpreter was used.       Past Medical History:  Diagnosis Date  . Medical history non-contributory   . Miscarriage     There are no problems to display for this patient.   Past Surgical History:  Procedure Laterality Date  . DILATION AND CURETTAGE OF UTERUS       OB History    Gravida  4   Para  2   Term  2   Preterm      AB  1   Living  2     SAB  1   IAB      Ectopic  0   Multiple      Live Births              Family History  Problem Relation Age of Onset  . Diabetes Mother   . Heart disease Mother   . Hypertension Mother   . Heart disease Father     Social History   Tobacco Use  . Smoking status: Current Every Day Smoker    Packs/day: 0.25    Years: 27.00    Pack years: 6.75  . Smokeless tobacco: Never Used  . Tobacco comment: quit with preg  Substance Use Topics  . Alcohol use: No  . Drug use: No    Home Medications Prior to Admission medications   Medication Sig Start Date End Date Taking? Authorizing Provider  Prenatal Vit-Fe Fumarate-FA (PRENATAL MULTIVITAMIN) TABS tablet Take 1 tablet by mouth daily at 12 noon.    [provider]    Allergies    Patient has no  known allergies.  Review of Systems   Review of Systems  All other systems reviewed and are negative.   Physical Exam Updated Vital Signs BP 129/70   Pulse (!) 58   Temp 98.8 F (37.1 C) (Oral)   Resp 15   Ht 5\' 3"  (1.6 m)   Wt 54.4 kg   SpO2 92%   BMI 21.26 kg/m   Physical Exam Vitals and nursing note reviewed.  Constitutional:      Appearance: She is well-developed.     Comments: Appears uncomfortable  HENT:     Head: Normocephalic and atraumatic.  Eyes:     Conjunctiva/sclera: Conjunctivae normal.  Cardiovascular:     Rate and Rhythm: Normal rate and regular rhythm.     Heart sounds: No murmur heard.   Pulmonary:     Effort: Pulmonary effort is normal. No respiratory distress.     Breath sounds: Normal breath sounds.  Abdominal:     Palpations: Abdomen is soft.     Tenderness: There is abdominal tenderness.     Comments: Generalized  abdominal tenderness  Musculoskeletal:        General: Normal range of motion.     Cervical back: Neck supple.  Skin:    General: Skin is warm and dry.  Neurological:     Mental Status: She is alert and oriented to person, place, and time.  Psychiatric:        Mood and Affect: Mood normal.        Behavior: Behavior normal.     ED Results / Procedures / Treatments   Labs (all labs ordered are listed, but only abnormal results are displayed) Labs Reviewed  COMPREHENSIVE METABOLIC PANEL - Abnormal; Notable for the following components:      Result Value   Total Protein 6.4 (*)    All other components within normal limits  CBC - Abnormal; Notable for the following components:   WBC 10.8 (*)    Hemoglobin 16.0 (*)    HCT 48.2 (*)    All other components within normal limits  URINALYSIS, ROUTINE W REFLEX MICROSCOPIC - Abnormal; Notable for the following components:   Color, Urine AMBER (*)    APPearance HAZY (*)    Specific Gravity, Urine 1.031 (*)    Hgb urine dipstick MODERATE (*)    Ketones, ur 80 (*)    Protein, ur  30 (*)    All other components within normal limits  RESP PANEL BY RT-PCR (FLU A&B, COVID) ARPGX2  LIPASE, BLOOD  I-STAT BETA HCG BLOOD, ED ([facility], [facility], [facility] ONLY)    EKG EKG Interpretation  Date/Time:  Thursday August 06 2020 03:15:33 EST Ventricular Rate:  57 PR Interval:    QRS Duration: 97 QT Interval:  421 QTC Calculation: 410 R Axis:   82 Text Interpretation: Sinus rhythm ST elev, probable normal early repol pattern No old tracing to compare Confirmed by Drema Pry [phone]) on 08/06/2020 3:30:02 AM   Radiology No results found. CLINICAL DATA: Diffuse abdominal pain, hematuria  EXAM: CT ABDOMEN AND PELVIS WITH CONTRAST  TECHNIQUE: Multidetector CT imaging of the abdomen and pelvis was performed using the standard protocol following bolus administration of intravenous contrast.  CONTRAST: OMNIPAQUE IOHEXOL 300 MG/ML SOLN  COMPARISON: None.  FINDINGS: Lower chest: Visualized lung bases are clear bilaterally. The visualized heart and pericardium are unremarkable.  Hepatobiliary: Scattered hypodensities are seen throughout the right hepatic lobe which are too small to accurately characterize but likely represent multiple small cysts or hemangioma in a patient without a history of malignancy. The liver is otherwise unremarkable. No intra or extrahepatic biliary ductal dilation. Gallbladder unremarkable.  Pancreas: Unremarkable  Spleen: Unremarkable  Adrenals/Urinary Tract: The adrenal glands are unremarkable. The kidneys are normal in size and position. Simple cortical cyst noted within the interpolar region of the right kidney. The kidneys are otherwise unremarkable. Bladder unremarkable.  Stomach/Bowel: There is inflammatory change involving the terminal ileum with mucosal hyperemia, mild surrounding mesenteric infiltration, and a a focal stricture identified roughly 5 cm proximal to the ileocecal junction resulting in a partial small bowel obstruction.  Additionally, there is a separate region of hyperemia and bowel wall thickening seen approximately within the ileum, best noted on coronal image # 22/5. More proximally, the small bowel appears fluid-filled with fecalized intraluminal contents. Together, these geographically separate regions of inflammation with stricture ring noted in the region of the ileocecal junction are compatible with Crohn's disease. Less likely, this can be seen with atypical infection, including mycobacterial ileitis. The stomach, small bowel, and large bowel are otherwise unremarkable.  Appendix normal. Mild free fluid within the right lower quadrant of the abdomen adjacent to the area of maximal inflammation. No free intraperitoneal gas.  Vascular/Lymphatic: The abdominal vasculature is unremarkable. No pathologic adenopathy within the abdomen and pelvis.  Reproductive: Uterus and bilateral adnexa are unremarkable.  Other: Tiny fat containing umbilical hernia.  Musculoskeletal: No acute bone abnormality. No lytic or blastic bone lesion.  IMPRESSION: Multiple, geographically distinct areas of inflammation within the terminal ileum with focal stricturing noted just proximal to the ileocecal junction suggestive of inflammatory bowel disease/Crohn's enteritis. Resultant partial small bowel obstruction. No evidence of perforation. No intra-abdominal abscess formation.   Electronically Signed By: Helyn Numbers MD On: 08/06/2020 04:53 Procedures Procedures   Medications Ordered in ED Medications  HYDROmorphone (DILAUDID) injection 1 mg (1 mg Intravenous Given 08/06/20 0359)  ondansetron (ZOFRAN) injection 4 mg (4 mg Intravenous Given 08/06/20 0400)  sodium chloride 0.9 % bolus 1,000 mL (1,000 mLs Intravenous New Bag/Given 08/06/20 0359)  iohexol (OMNIPAQUE) 300 MG/ML solution 100 mL (100 mLs Intravenous Contrast Given 08/06/20 0434)    ED Course  I have reviewed the triage vital signs and the nursing  notes.  Pertinent labs & imaging results that were available during my care of the patient were reviewed by me and considered in my medical decision making (see chart for details).    MDM Rules/Calculators/A&P                          Patient here with abdominal pain x2 days.  She does have generalized tenderness to her abdomen.  Abdomen is not distended.  Vital signs are stable.  She does have a mild leukocytosis.  Will check CT based on tenderness.  CT shows multiple distinct areas of inflammation within the terminal ileum with focal stricturing just proximal to the ileocecal junction suggestive of IBD/Crohn's enteritis and a resultant partial small bowel obstruction.  No evidence of perforation.  No evidence of abscess.  I discussed case with Dr. Antionette Char, who is appreciated for admitting the patient.  Patient is receiving fluids.  She is n.p.o.  Plan: Admit. Final Clinical Impression(s) / ED Diagnoses Final diagnoses:  Partial small bowel obstruction [facility])    Rx / DC Orders ED Discharge Orders    None       Roxy Horseman, PA-C 08/06/20 0535    Nira Conn, MD 08/06/20 [phone]

## 2020-08-06 NOTE — H&P (Signed)
History and Physical    Jeanne Myers IWL:[id number] DOB: 07/06/1970 DOA: 08/05/2020  PCP: Patient, No Pcp Per   Patient coming from: Home   Chief Complaint: Abdominal pain   HPI: Jeanne Myers is a 50 y.o. female who denies any significant past medical history and now presents emergency department with 2 days of abdominal pain.  Patient reports developing abdominal pain yesterday which has been constant, most severe in the right lower quadrant, associated with some nausea but no vomiting.  She had never experienced this previously.  She denies any diarrhea, melena, hematochezia, fevers, or chills.  ED Course: Upon arrival to the ED, patient is found to be afebrile, saturating well on room air, and with stable blood pressure.  EKG features sinus rhythm.  Chemistry panel unremarkable.  Lipase is normal. CBC notable for mild leukocytosis and slight polycythemia.  CT the abdomen and pelvis demonstrates multiple geographically distinct areas of inflammation within the terminal ileum with focal stricture just proximal to the ileocecal junction suspicious for Crohn's enteritis with partial SBO.  Patient was treated with IV fluids, Dilaudid, and Zofran in the ED.  Review of Systems:  All other systems reviewed and apart from HPI, are negative.  Past Medical History:  Diagnosis Date  . Medical history non-contributory   . Miscarriage     Past Surgical History:  Procedure Laterality Date  . DILATION AND CURETTAGE OF UTERUS      Social History:   reports that she has been smoking. She has a 6.75 pack-year smoking history. She has never used smokeless tobacco. She reports that she does not drink alcohol and does not use drugs.  No Known Allergies  Family History  Problem Relation Age of Onset  . Diabetes Mother   . Heart disease Mother   . Hypertension Mother   . Heart disease Father      Prior to Admission medications   Medication Sig Start Date End Date Taking?  Authorizing Provider  Prenatal Vit-Fe Fumarate-FA (PRENATAL MULTIVITAMIN) TABS tablet Take 1 tablet by mouth daily at 12 noon.    [provider]    Physical Exam: Vitals:   08/06/20 0600 08/06/20 0615 08/06/20 0630 08/06/20 0645  BP: 119/82 (!) 142/74 (!) 142/86 (!) 144/89  Pulse: (!) 53 (!) 54 (!) 54 (!) 50  Resp: (!) 21 11 (!) 21 17  Temp:      TempSrc:      SpO2: 100% 98% 97% 96%  Weight:      Height:        Constitutional: NAD, calm  Eyes: PERTLA, lids and conjunctivae normal ENMT: Mucous membranes are moist. Posterior pharynx clear of any exudate or lesions.   Neck: normal, supple, no masses, no thyromegaly Respiratory:  no wheezing, no crackles. No accessory muscle use.  Cardiovascular: S1 & S2 heard, regular rate and rhythm. No extremity edema.   Abdomen: No distension, soft, tender in RLQ, no guarding. Bowel sounds active.  Musculoskeletal: no clubbing / cyanosis. No joint deformity upper and lower extremities.   Skin: no significant rashes, lesions, ulcers. Warm, dry, well-perfused. Neurologic: CN 2-12 grossly intact. Sensation intact. Moving all extremities.  Psychiatric: Alert and oriented to person, place, and situation. Pleasant and cooperative.    Labs and Imaging on Admission: I have personally reviewed following labs and imaging studies  CBC: Recent Labs  Lab 08/05/20 2317  WBC 10.8*  HGB 16.0*  HCT 48.2*  MCV 94.3  PLT 327   Basic Metabolic  Panel: Recent Labs  Lab 08/05/20 2317  NA 135  K 3.7  CL 103  CO2 24  GLUCOSE 96  BUN 9  CREATININE 0.76  CALCIUM 9.3   GFR: Estimated Creatinine Clearance: 70.4 mL/min (by C-G formula based on SCr of 0.76 mg/dL). Liver Function Tests: Recent Labs  Lab 08/05/20 2317  AST 15  ALT 13  ALKPHOS 54  BILITOT 0.8  PROT 6.4*  ALBUMIN 3.7   Recent Labs  Lab 08/05/20 2317  LIPASE 25   No results for input(s): AMMONIA in the last 168 hours. Coagulation Profile: No results for input(s): INR,  PROTIME in the last 168 hours. Cardiac Enzymes: No results for input(s): CKTOTAL, CKMB, CKMBINDEX, TROPONINI in the last 168 hours. BNP (last 3 results) No results for input(s): PROBNP in the last 8760 hours. HbA1C: No results for input(s): HGBA1C in the last 72 hours. CBG: No results for input(s): GLUCAP in the last 168 hours. Lipid Profile: No results for input(s): CHOL, HDL, LDLCALC, TRIG, CHOLHDL, LDLDIRECT in the last 72 hours. Thyroid Function Tests: No results for input(s): TSH, T4TOTAL, FREET4, T3FREE, THYROIDAB in the last 72 hours. Anemia Panel: No results for input(s): VITAMINB12, FOLATE, FERRITIN, TIBC, IRON, RETICCTPCT in the last 72 hours. Urine analysis:    Component Value Date/Time   COLORURINE AMBER (A) 08/05/2020 2312   APPEARANCEUR HAZY (A) 08/05/2020 2312   LABSPEC 1.031 (H) 08/05/2020 2312   PHURINE 5.0 08/05/2020 2312   GLUCOSEU NEGATIVE 08/05/2020 2312   HGBUR MODERATE (A) 08/05/2020 2312   BILIRUBINUR NEGATIVE 08/05/2020 2312   KETONESUR 80 (A) 08/05/2020 2312   PROTEINUR 30 (A) 08/05/2020 2312   UROBILINOGEN 0.2 01/14/2014 0715   NITRITE NEGATIVE 08/05/2020 2312   LEUKOCYTESUR NEGATIVE 08/05/2020 2312   Sepsis Labs: @LABRCNTIP (procalcitonin:4,lacticidven:4) )No results found for this or any previous visit (from the past 240 hour(s)).   Radiological Exams on Admission: No results found.  EKG: Independently reviewed. Sinus rhythm, early repolarization pattern.   Assessment/Plan   1. Partial SBO, suspected Crohn disease  - Presents with 2 days of abdominal pain and found on CT to have inflammatory changes involving terminal ileum with stricture just proximal to ileocecal junction causing partial SBO  - She was provided IVF, analgesics, and antiemetics in ED  - GI consulting and much appreciated  - Continue IVF hydration and pain-control, follow-up GI recommendations    2. Smoker  - 1/2 ppd smoker, wants to quit, nicotine patch provided     DVT  prophylaxis: SCDs  Code Status: Full  Level of Care: Level of care: Med-Surg Family Communication: none present  Disposition Plan:  Patient is from: Home  Anticipated d/c is to: Home  Anticipated d/c date is: Possibly as early as 08/07/20 Patient currently: Pending pain-control, possible GI consultation  Consults called: Dr. 10/07/20 of GI  Admission status: Inpatient   Spanish Interpreter: [employer] [facility]   #[id number], MD [facility]  08/06/2020, 6:56 AM

## 2020-08-07 DIAGNOSIS — Z20822 Contact with and (suspected) exposure to covid-19: Secondary | ICD-10-CM | POA: Diagnosis present

## 2020-08-07 DIAGNOSIS — K50018 Crohn's disease of small intestine with other complication: Secondary | ICD-10-CM

## 2020-08-07 DIAGNOSIS — Z720 Tobacco use: Secondary | ICD-10-CM | POA: Diagnosis not present

## 2020-08-07 DIAGNOSIS — Z833 Family history of diabetes mellitus: Secondary | ICD-10-CM | POA: Diagnosis not present

## 2020-08-07 DIAGNOSIS — K5669 Other partial intestinal obstruction: Secondary | ICD-10-CM | POA: Diagnosis present

## 2020-08-07 DIAGNOSIS — F17210 Nicotine dependence, cigarettes, uncomplicated: Secondary | ICD-10-CM | POA: Diagnosis present

## 2020-08-07 DIAGNOSIS — K50019 Crohn's disease of small intestine with unspecified complications: Secondary | ICD-10-CM | POA: Diagnosis not present

## 2020-08-07 DIAGNOSIS — K56609 Unspecified intestinal obstruction, unspecified as to partial versus complete obstruction: Secondary | ICD-10-CM | POA: Diagnosis present

## 2020-08-07 DIAGNOSIS — F1990 Other psychoactive substance use, unspecified, uncomplicated: Secondary | ICD-10-CM | POA: Diagnosis not present

## 2020-08-07 DIAGNOSIS — K50012 Crohn's disease of small intestine with intestinal obstruction: Secondary | ICD-10-CM | POA: Diagnosis present

## 2020-08-07 DIAGNOSIS — R109 Unspecified abdominal pain: Secondary | ICD-10-CM | POA: Diagnosis present

## 2020-08-07 DIAGNOSIS — K635 Polyp of colon: Secondary | ICD-10-CM | POA: Diagnosis present

## 2020-08-07 DIAGNOSIS — K56600 Partial intestinal obstruction, unspecified as to cause: Secondary | ICD-10-CM | POA: Diagnosis not present

## 2020-08-07 DIAGNOSIS — R933 Abnormal findings on diagnostic imaging of other parts of digestive tract: Secondary | ICD-10-CM | POA: Diagnosis not present

## 2020-08-07 DIAGNOSIS — Z8249 Family history of ischemic heart disease and other diseases of the circulatory system: Secondary | ICD-10-CM | POA: Diagnosis not present

## 2020-08-07 LAB — MAGNESIUM: Magnesium: 1.5 mg/dL — ABNORMAL LOW (ref 1.7–2.4)

## 2020-08-07 LAB — COMPREHENSIVE METABOLIC PANEL
ALT: 10 U/L (ref 0–44)
AST: 13 U/L — ABNORMAL LOW (ref 15–41)
Albumin: 3.1 g/dL — ABNORMAL LOW (ref 3.5–5.0)
Alkaline Phosphatase: 47 U/L (ref 38–126)
Anion gap: 10 (ref 5–15)
BUN: 10 mg/dL (ref 6–20)
CO2: 22 mmol/L (ref 22–32)
Calcium: 8.7 mg/dL — ABNORMAL LOW (ref 8.9–10.3)
Chloride: 102 mmol/L (ref 98–111)
Creatinine, Ser: 0.77 mg/dL (ref 0.44–1.00)
GFR, Estimated: >60 mL/min (ref >60)
Glucose, Bld: 80 mg/dL (ref 70–99)
Potassium: 4.2 mmol/L (ref 3.5–5.1)
Sodium: 134 mmol/L — ABNORMAL LOW (ref 135–145)
Total Bilirubin: 1 mg/dL (ref 0.3–1.2)
Total Protein: 5.4 g/dL — ABNORMAL LOW (ref 6.5–8.1)

## 2020-08-07 LAB — CBC WITH DIFFERENTIAL/PLATELET
Abs Immature Granulocytes: 0.03 10*3/uL (ref 0.00–0.07)
Basophils Absolute: 0 10*3/uL (ref 0.0–0.1)
Basophils Relative: 0 %
Eosinophils Absolute: 0 10*3/uL (ref 0.0–0.5)
Eosinophils Relative: 0 %
HCT: 42.6 % (ref 36.0–46.0)
Hemoglobin: 14.5 g/dL (ref 12.0–15.0)
Immature Granulocytes: 0 %
Lymphocytes Relative: 17 %
Lymphs Abs: 1.8 10*3/uL (ref 0.7–4.0)
MCH: 31.6 pg (ref 26.0–34.0)
MCHC: 34 g/dL (ref 30.0–36.0)
MCV: 92.8 fL (ref 80.0–100.0)
Monocytes Absolute: 0.4 10*3/uL (ref 0.1–1.0)
Monocytes Relative: 4 %
Neutro Abs: 8.2 10*3/uL — ABNORMAL HIGH (ref 1.7–7.7)
Neutrophils Relative %: 79 %
Platelets: 261 10*3/uL (ref 150–400)
RBC: 4.59 MIL/uL (ref 3.87–5.11)
RDW: 12.7 % (ref 11.5–15.5)
WBC: 10.4 10*3/uL (ref 4.0–10.5)
nRBC: 0 % (ref 0.0–0.2)

## 2020-08-07 LAB — PHOSPHORUS: Phosphorus: 4.2 mg/dL (ref 2.5–4.6)

## 2020-08-07 MED ORDER — MAGNESIUM SULFATE 2 GM/50ML IV SOLN
2.0000 g | Freq: Once | INTRAVENOUS | Status: AC
  Administered 2020-08-07: 2 g via INTRAVENOUS
  Filled 2020-08-07: qty 50

## 2020-08-07 MED ORDER — LACTATED RINGERS IV SOLN: INTRAVENOUS | Status: AC

## 2020-08-07 NOTE — Progress Notes (Signed)
Subjective: Feeling better.  Pain is better.  No bowel movements.  Objective: Vital signs in last 24 hours: Temp:  [97.6 F (36.4 C)-98.2 F (36.8 C)] 97.9 F (36.6 C) (03/11 0554) Pulse Rate:  [51-67] 63 (03/11 0554) Resp:  [15-18] 16 (03/11 0554) BP: (122-146)/(66-89) 122/76 (03/11 0554) SpO2:  [95 %-100 %] 100 % (03/11 0554) Last BM Date: 08/04/20  Intake/Output from previous day: 03/10 0701 - 03/11 0700 In: 3068.1 [I.V.:2068.1; IV Piggyback:1000] Out: -  Intake/Output this shift: No intake/output data recorded.  General appearance: alert and no distress GI: less tender in the RLQ  Lab Results: Recent Labs    08/05/20 2317 08/07/20 0209  WBC 10.8* 10.4  HGB 16.0* 14.5  HCT 48.2* 42.6  PLT 327 261   BMET Recent Labs    08/05/20 2317 08/07/20 0209  NA 135 134*  K 3.7 4.2  CL 103 102  CO2 24 22  GLUCOSE 96 80  BUN 9 10  CREATININE 0.76 0.77  CALCIUM 9.3 8.7*   LFT Recent Labs    08/07/20 0209  PROT 5.4*  ALBUMIN 3.1*  AST 13*  ALT 10  ALKPHOS 47  BILITOT 1.0   PT/INR No results for input(s): LABPROT, INR in the last 72 hours. Hepatitis Panel No results for input(s): HEPBSAG, HCVAB, HEPAIGM, HEPBIGM in the last 72 hours. C-Diff No results for input(s): CDIFFTOX in the last 72 hours. Fecal Lactopherrin No results for input(s): FECLLACTOFRN in the last 72 hours.  Studies/Results: CT ABDOMEN PELVIS W CONTRAST  Result Date: 08/06/2020 CLINICAL DATA:  Diffuse abdominal pain, hematuria EXAM: CT ABDOMEN AND PELVIS WITH CONTRAST TECHNIQUE: Multidetector CT imaging of the abdomen and pelvis was performed using the standard protocol following bolus administration of intravenous contrast. CONTRAST:  OMNIPAQUE IOHEXOL 300 MG/ML  SOLN COMPARISON:  None. FINDINGS: Lower chest: Visualized lung bases are clear bilaterally. The visualized heart and pericardium are unremarkable. Hepatobiliary: Scattered hypodensities are seen throughout the right hepatic  lobe which are too small to accurately characterize but likely represent multiple small cysts or hemangioma in a patient without a history of malignancy. The liver is otherwise unremarkable. No intra or extrahepatic biliary ductal dilation. Gallbladder unremarkable. Pancreas: Unremarkable Spleen: Unremarkable Adrenals/Urinary Tract: The adrenal glands are unremarkable. The kidneys are normal in size and position. Simple cortical cyst noted within the interpolar region of the right kidney. The kidneys are otherwise unremarkable. Bladder unremarkable. Stomach/Bowel: There is inflammatory change involving the terminal ileum with mucosal hyperemia, mild surrounding mesenteric infiltration, and a a focal stricture identified roughly 5 cm proximal to the ileocecal junction resulting in a partial small bowel obstruction. Additionally, there is a separate region of hyperemia and bowel wall thickening seen approximately within the ileum, best noted on coronal image # 22/5. More proximally, the small bowel appears fluid-filled with fecalized intraluminal contents. Together, these geographically separate regions of inflammation with stricture ring noted in the region of the ileocecal junction are compatible with Crohn's disease. Less likely, this can be seen with atypical infection, including mycobacterial ileitis. The stomach, small bowel, and large bowel are otherwise unremarkable. Appendix normal. Mild free fluid within the right lower quadrant of the abdomen adjacent to the area of maximal inflammation. No free intraperitoneal gas. Vascular/Lymphatic: The abdominal vasculature is unremarkable. No pathologic adenopathy within the abdomen and pelvis. Reproductive: Uterus and bilateral adnexa are unremarkable. Other: Tiny fat containing umbilical hernia. Musculoskeletal: No acute bone abnormality. No lytic or blastic bone lesion. IMPRESSION: Multiple, geographically distinct areas of  inflammation within the terminal ileum  with focal stricturing noted just proximal to the ileocecal junction suggestive of inflammatory bowel disease/Crohn's enteritis. Resultant partial small bowel obstruction. No evidence of perforation. No intra-abdominal abscess formation. Electronically Signed   By: Helyn Numbers MD   On: 08/06/2020 04:53    Medications:  Scheduled:  methylPREDNISolone (SOLU-MEDROL) injection  40 mg Intravenous Q12H   nicotine  14 mg Transdermal Daily   Continuous:  magnesium sulfate bolus IVPB 2 g (08/07/20 0848)    Assessment/Plan: 1) Ileal stricture. 2) Ileitis.   The patient is feeling better.  Communication was limited as the Spanish interpreter was not available at the time of the rounding.  Palpation of the RLQ still elicited some pain.    Plan: 1) Continue with emperic treatment with Solumedrol. 2) Advance to a clear liquid diet. 3) Potential colonoscopy this weekend pending her continued response to Solumedrol.  LOS: 0 days   Stamatia Masri D 08/07/2020, 8:57 AM

## 2020-08-07 NOTE — Progress Notes (Signed)
PROGRESS NOTE  Jeanne Myers ZOX:[id number] DOB: [birth year] DOA: 08/05/2020 PCP: Patient, No Pcp Per   LOS: 0 days   Brief narrative:  Jeanne Myers is a 50 y.o. female with no significant past medical history presented to hospital with 2-day history of abdominal pain mostly in the right lower quadrant without any fever chills.  In the ED, patient was noted to have mild leukocytosis.  CT scan of the abdomen and pelvis showed inflammation within the terminal ileum with focal stricture suspicious for Crohn's disease with partial SBO. Patient was treated with IV fluids, Dilaudid, and Zofran in the ED and was admitted to hospital for further evaluation and treatment.  Assessment/Plan:  Principal Problem:   Partial small bowel obstruction (HCC) Active Problems:   Tobacco abuse  Partial SBO, suspected Crohn disease CT scan with inflammation of the terminal ileum with stricture.    GI on board and patient has been started on IV Solu-Medrol.  Patient still continues to have pain on the right lower quadrant.  Has had flatus but no nausea or vomiting.   Continue IV fluids,  has been started on clears.  IV antiemetics.  Follow GI recommendation.  Current smoker -1/2 ppd smoker, on nicotine patch.  Hypomagnesemia.  We will give IV magnesium sulfate 2 g today.  Check levels in a.m.  DVT prophylaxis: SCDs Start: 08/06/20 8119  Code Status: Full code  Family Communication: Spoke with the patient's family yesterday  Status is: Observation  The patient will require care spanning > 2 midnights and should be moved to inpatient because: Unsafe d/c plan, IV treatments appropriate due to intensity of illness or inability to take PO and Inpatient level of care appropriate due to severity of illness  Dispo: The patient is from: Home              Anticipated d/c is to: Home              Patient currently is not medically stable to d/c.   Difficult to place patient  No   Consultants:  GI  Procedures:  None  Anti-infectives:  . None  Anti-infectives (From admission, onward)   None     Subjective: Today, patient was seen and examined at bedside.  The time of my evaluation patient complains of right lower quadrant pain.  Denies any nausea vomiting has had flatus.  Denies bowel movement.  Objective: Vitals:   08/07/20 0524 08/07/20 0554  BP: 122/76 122/76  Pulse: 63 63  Resp: 17 16  Temp: 97.9 F (36.6 C) 97.9 F (36.6 C)  SpO2: 98% 100%    Intake/Output Summary (Last 24 hours) at 08/07/2020 1055 Last data filed at 08/07/2020 0644 Gross per 24 hour  Intake 2068.06 ml  Output --  Net 2068.06 ml   Filed Weights   08/05/20 2308  Weight: 54.4 kg   Body mass index is 21.26 kg/m.   Physical Exam:  GENERAL: Patient is alert awake and oriented. Not in obvious distress. HENT: No scleral pallor or icterus. Pupils equally reactive to light. Oral mucosa is moist NECK: is supple, no gross swelling noted. CHEST: Clear to auscultation. No crackles or wheezes.  Diminished breath sounds bilaterally. CVS: S1 and S2 heard, no murmur. Regular rate and rhythm.  ABDOMEN: Soft, right lower quadrant tenderness on palpation, bowel sounds are present. EXTREMITIES: No edema. CNS: Cranial nerves are intact. No focal motor deficits. SKIN: warm and dry without rashes.  Data Review: I have personally  reviewed the following laboratory data and studies,  CBC: Recent Labs  Lab 08/05/20 2317 08/07/20 0209  WBC 10.8* 10.4  NEUTROABS  --  8.2*  HGB 16.0* 14.5  HCT 48.2* 42.6  MCV 94.3 92.8  PLT 327 261   Basic Metabolic Panel: Recent Labs  Lab 08/05/20 2317 08/07/20 0209  NA 135 134*  K 3.7 4.2  CL 103 102  CO2 24 22  GLUCOSE 96 80  BUN 9 10  CREATININE 0.76 0.77  CALCIUM 9.3 8.7*  MG  --  1.5*  PHOS  --  4.2   Liver Function Tests: Recent Labs  Lab 08/05/20 2317 08/07/20 0209  AST 15 13*  ALT 13 10  ALKPHOS 54 47  BILITOT  0.8 1.0  PROT 6.4* 5.4*  ALBUMIN 3.7 3.1*   Recent Labs  Lab 08/05/20 2317  LIPASE 25   No results for input(s): AMMONIA in the last 168 hours. Cardiac Enzymes: No results for input(s): CKTOTAL, CKMB, CKMBINDEX, TROPONINI in the last 168 hours. BNP (last 3 results) No results for input(s): BNP in the last 8760 hours.  ProBNP (last 3 results) No results for input(s): PROBNP in the last 8760 hours.  CBG: No results for input(s): GLUCAP in the last 168 hours. Recent Results (from the past 240 hour(s))  Resp Panel by RT-PCR (Flu A&B, Covid) Nasopharyngeal Swab     Status: None   Collection Time: 08/06/20  5:15 AM   Specimen: Nasopharyngeal Swab; Nasopharyngeal(NP) swabs in vial transport medium  Result Value Ref Range Status   SARS Coronavirus 2 by RT PCR NEGATIVE NEGATIVE Final    Comment: (NOTE) SARS-CoV-2 target nucleic acids are NOT DETECTED.  The SARS-CoV-2 RNA is generally detectable in upper respiratory specimens during the acute phase of infection. The lowest concentration of SARS-CoV-2 viral copies this assay can detect is 138 copies/mL. A negative result does not preclude SARS-Cov-2 infection and should not be used as the sole basis for treatment or other patient management decisions. A negative result may occur with  improper specimen collection/handling, submission of specimen other than nasopharyngeal swab, presence of viral mutation(s) within the areas targeted by this assay, and inadequate number of viral copies(<138 copies/mL). A negative result must be combined with clinical observations, patient history, and epidemiological information. The expected result is Negative.  Fact Sheet for Patients:  BloggerCourse.com  Fact Sheet for Healthcare Providers:  SeriousBroker.it  This test is no t yet approved or cleared by the [place] FDA and  has been authorized for detection and/or diagnosis of SARS-CoV-2  by FDA under an Emergency Use Authorization (EUA). This EUA will remain  in effect (meaning this test can be used) for the duration of the COVID-19 declaration under Section 564(b)(1) of the Act, 21 U.S.C.section 360bbb-3(b)(1), unless the authorization is terminated  or revoked sooner.       Influenza A by PCR NEGATIVE NEGATIVE Final   Influenza B by PCR NEGATIVE NEGATIVE Final    Comment: (NOTE) The Xpert Xpress SARS-CoV-2/FLU/RSV plus assay is intended as an aid in the diagnosis of influenza from Nasopharyngeal swab specimens and should not be used as a sole basis for treatment. Nasal washings and aspirates are unacceptable for Xpert Xpress SARS-CoV-2/FLU/RSV testing.  Fact Sheet for Patients: BloggerCourse.com  Fact Sheet for Healthcare Providers: SeriousBroker.it  This test is not yet approved or cleared by the [place] FDA and has been authorized for detection and/or diagnosis of SARS-CoV-2 by FDA under an Emergency Use Authorization (EUA). This  EUA will remain in effect (meaning this test can be used) for the duration of the COVID-19 declaration under Section 564(b)(1) of the Act, 21 U.S.C. section 360bbb-3(b)(1), unless the authorization is terminated or revoked.  Performed at [facility], [address], [place], [place] [address]      Studies: CT ABDOMEN PELVIS W CONTRAST  Result Date: 08/06/2020 CLINICAL DATA:  Diffuse abdominal pain, hematuria EXAM: CT ABDOMEN AND PELVIS WITH CONTRAST TECHNIQUE: Multidetector CT imaging of the abdomen and pelvis was performed using the standard protocol following bolus administration of intravenous contrast. CONTRAST:  OMNIPAQUE IOHEXOL 300 MG/ML  SOLN COMPARISON:  None. FINDINGS: Lower chest: Visualized lung bases are clear bilaterally. The visualized heart and pericardium are unremarkable. Hepatobiliary: Scattered hypodensities are seen throughout the right  hepatic lobe which are too small to accurately characterize but likely represent multiple small cysts or hemangioma in a patient without a history of malignancy. The liver is otherwise unremarkable. No intra or extrahepatic biliary ductal dilation. Gallbladder unremarkable. Pancreas: Unremarkable Spleen: Unremarkable Adrenals/Urinary Tract: The adrenal glands are unremarkable. The kidneys are normal in size and position. Simple cortical cyst noted within the interpolar region of the right kidney. The kidneys are otherwise unremarkable. Bladder unremarkable. Stomach/Bowel: There is inflammatory change involving the terminal ileum with mucosal hyperemia, mild surrounding mesenteric infiltration, and a a focal stricture identified roughly 5 cm proximal to the ileocecal junction resulting in a partial small bowel obstruction. Additionally, there is a separate region of hyperemia and bowel wall thickening seen approximately within the ileum, best noted on coronal image # 22/5. More proximally, the small bowel appears fluid-filled with fecalized intraluminal contents. Together, these geographically separate regions of inflammation with stricture ring noted in the region of the ileocecal junction are compatible with Crohn's disease. Less likely, this can be seen with atypical infection, including mycobacterial ileitis. The stomach, small bowel, and large bowel are otherwise unremarkable. Appendix normal. Mild free fluid within the right lower quadrant of the abdomen adjacent to the area of maximal inflammation. No free intraperitoneal gas. Vascular/Lymphatic: The abdominal vasculature is unremarkable. No pathologic adenopathy within the abdomen and pelvis. Reproductive: Uterus and bilateral adnexa are unremarkable. Other: Tiny fat containing umbilical hernia. Musculoskeletal: No acute bone abnormality. No lytic or blastic bone lesion. IMPRESSION: Multiple, geographically distinct areas of inflammation within the terminal  ileum with focal stricturing noted just proximal to the ileocecal junction suggestive of inflammatory bowel disease/Crohn's enteritis. Resultant partial small bowel obstruction. No evidence of perforation. No intra-abdominal abscess formation. Electronically Signed   By: Helyn Numbers MD   On: 08/06/2020 04:53      Joycelyn Das, MD  [facility] 08/07/2020  If 7PM-7AM, please contact night-coverage

## 2020-08-08 ENCOUNTER — Inpatient Hospital Stay (HOSPITAL_COMMUNITY): Payer: Medicaid Other

## 2020-08-08 DIAGNOSIS — F1990 Other psychoactive substance use, unspecified, uncomplicated: Secondary | ICD-10-CM | POA: Diagnosis not present

## 2020-08-08 DIAGNOSIS — K56600 Partial intestinal obstruction, unspecified as to cause: Secondary | ICD-10-CM

## 2020-08-08 DIAGNOSIS — K50019 Crohn's disease of small intestine with unspecified complications: Secondary | ICD-10-CM | POA: Diagnosis not present

## 2020-08-08 DIAGNOSIS — R933 Abnormal findings on diagnostic imaging of other parts of digestive tract: Secondary | ICD-10-CM | POA: Diagnosis not present

## 2020-08-08 LAB — BASIC METABOLIC PANEL
Anion gap: 9 (ref 5–15)
BUN: 13 mg/dL (ref 6–20)
CO2: 27 mmol/L (ref 22–32)
Calcium: 9.1 mg/dL (ref 8.9–10.3)
Chloride: 98 mmol/L (ref 98–111)
Creatinine, Ser: 0.87 mg/dL (ref 0.44–1.00)
GFR, Estimated: >60 mL/min (ref >60)
Glucose, Bld: 98 mg/dL (ref 70–99)
Potassium: 4.2 mmol/L (ref 3.5–5.1)
Sodium: 134 mmol/L — ABNORMAL LOW (ref 135–145)

## 2020-08-08 LAB — PHOSPHORUS: Phosphorus: 4.7 mg/dL — ABNORMAL HIGH (ref 2.5–4.6)

## 2020-08-08 LAB — C-REACTIVE PROTEIN: CRP: 0.6 mg/dL (ref <1.0)

## 2020-08-08 LAB — CBC
HCT: 46.7 % — ABNORMAL HIGH (ref 36.0–46.0)
Hemoglobin: 15.9 g/dL — ABNORMAL HIGH (ref 12.0–15.0)
MCH: 31.5 pg (ref 26.0–34.0)
MCHC: 34 g/dL (ref 30.0–36.0)
MCV: 92.7 fL (ref 80.0–100.0)
Platelets: 309 10*3/uL (ref 150–400)
RBC: 5.04 MIL/uL (ref 3.87–5.11)
RDW: 12.7 % (ref 11.5–15.5)
WBC: 15 10*3/uL — ABNORMAL HIGH (ref 4.0–10.5)
nRBC: 0 % (ref 0.0–0.2)

## 2020-08-08 LAB — MAGNESIUM: Magnesium: 1.9 mg/dL (ref 1.7–2.4)

## 2020-08-08 LAB — HEPATITIS B SURFACE ANTIGEN: Hepatitis B Surface Ag: NONREACTIVE

## 2020-08-08 LAB — SEDIMENTATION RATE: Sed Rate: 1 mm/hr (ref 0–22)

## 2020-08-08 MED ORDER — METHYLPREDNISOLONE SODIUM SUCC 40 MG IJ SOLR
20.0000 mg | Freq: Three times a day (TID) | INTRAMUSCULAR | Status: DC
  Administered 2020-08-08 – 2020-08-12 (×12): 20 mg via INTRAVENOUS
  Filled 2020-08-08 (×12): qty 1

## 2020-08-08 MED ORDER — ALUM & MAG HYDROXIDE-SIMETH 200-200-20 MG/5ML PO SUSP: 30.0000 mL | ORAL | Status: DC | PRN

## 2020-08-08 MED ORDER — LACTATED RINGERS IV SOLN: INTRAVENOUS | Status: AC

## 2020-08-08 NOTE — Progress Notes (Signed)
PROGRESS NOTE  Jeanne Myers MLY:[id number] DOB: Jun 18, 1970 DOA: 08/05/2020 PCP: Patient, No Pcp Per   LOS: 1 day   Brief narrative:  Jeanne Myers is a 50 y.o. female with no significant past medical history presented to hospital with 2-day history of abdominal pain mostly in the right lower quadrant without any fever chills.  In the ED, patient was noted to have mild leukocytosis.  CT scan of the abdomen and pelvis showed inflammation within the terminal ileum with focal stricture suspicious for Crohn's disease with partial SBO. Patient was treated with IV fluids, Dilaudid, and Zofran in the ED and was admitted to hospital for further evaluation and treatment.  After hospitalization, patient was given IV Solu-Medrol and GI closelye followed the patient.  Assessment/Plan:  Principal Problem:   Partial small bowel obstruction (HCC) Active Problems:   Tobacco abuse   SBO (small bowel obstruction) (HCC)  Partial SBO, suspected Crohn disease CT scan with inflammation of the terminal ileum with stricture.    GI on board and patient has been started on IV Solu-Medrol.  Patient did have some pain in the abdomen and required IV narcotics.  Still complains of right lower quadrant pain on palpation.  No nausea vomiting or bowel movement but had flatus.  Continue IV fluids, on clears.  IV antiemetics.  Continue IV Solu-Medrol. Follow GI recommendation.  She might need colonoscopy if continued to have symptoms and is not improved.  Mild leukocytosis.  We will continue to monitor closely  Current smoker -1/2 ppd smoker, on nicotine patch.  Hypomagnesemia.  Received IV magnesium sulfate yesterday.  Improved at this time  Hyponatremia.  We will continue to monitor.  DVT prophylaxis: SCDs Start: 08/06/20 8127  Code Status: Full code  Family Communication:  None today.    Status is: Inpatient  The patient is inpatient because: Unsafe d/c plan, IV treatments appropriate due to  intensity of illness or inability to take PO and Inpatient level of care appropriate due to severity of illness GI evaluation, possible need for colonoscopy  Dispo: The patient is from: Home              Anticipated d/c is to: Home              Patient currently is not medically stable to d/c.   Difficult to place patient No   Consultants:  GI  Procedures:  None  Anti-infectives:  . None  Anti-infectives (From admission, onward)   None     Subjective: Today, patient was seen and examined at bedside.  Patient still complains of right lower quadrant pain and required IV narcotics this morning.  Denies any nausea, vomiting or bowel movement but has been having some flatus.  Objective: Vitals:   08/07/20 2026 08/08/20 0437  BP: 132/77 140/83  Pulse: 61 60  Resp: 16 16  Temp: 98.3 F (36.8 C) 97.8 F (36.6 C)  SpO2: 100% 94%    Intake/Output Summary (Last 24 hours) at 08/08/2020 1019 Last data filed at 08/08/2020 0700 Gross per 24 hour  Intake 2476.28 ml  Output --  Net 2476.28 ml   Filed Weights   08/05/20 2308  Weight: 54.4 kg   Body mass index is 21.26 kg/m.   Physical Exam:  General:  Average built, not in obvious distress HENT:   No scleral pallor or icterus noted. Oral mucosa is moist.  Chest:  Clear breath sounds.  Diminished breath sounds bilaterally. No crackles or wheezes.  CVS: S1 &S2 heard. No murmur.  Regular rate and rhythm. Abdomen: Soft, right lower quadrant tenderness on palpation.  Nondistended.  Bowel sounds are heard.   Extremities: No cyanosis, clubbing or edema.  Peripheral pulses are palpable. Psych: Alert, awake and oriented, normal mood CNS:  No cranial nerve deficits.  Power equal in all extremities.   Skin: Warm and dry.  No rashes noted.   Data Review: I have personally reviewed the following laboratory data and studies,  CBC: Recent Labs  Lab 08/05/20 2317 08/07/20 0209 08/08/20 0246  WBC 10.8* 10.4 15.0*  NEUTROABS  --   8.2*  --   HGB 16.0* 14.5 15.9*  HCT 48.2* 42.6 46.7*  MCV 94.3 92.8 92.7  PLT 327 261 309   Basic Metabolic Panel: Recent Labs  Lab 08/05/20 2317 08/07/20 0209 08/08/20 0246  NA 135 134* 134*  K 3.7 4.2 4.2  CL 103 102 98  CO2 24 22 27   GLUCOSE 96 80 98  BUN 9 10 13   CREATININE 0.76 0.77 0.87  CALCIUM 9.3 8.7* 9.1  MG  --  1.5* 1.9  PHOS  --  4.2 4.7*   Liver Function Tests: Recent Labs  Lab 08/05/20 2317 08/07/20 0209  AST 15 13*  ALT 13 10  ALKPHOS 54 47  BILITOT 0.8 1.0  PROT 6.4* 5.4*  ALBUMIN 3.7 3.1*   Recent Labs  Lab 08/05/20 2317  LIPASE 25   No results for input(s): AMMONIA in the last 168 hours. Cardiac Enzymes: No results for input(s): CKTOTAL, CKMB, CKMBINDEX, TROPONINI in the last 168 hours. BNP (last 3 results) No results for input(s): BNP in the last 8760 hours.  ProBNP (last 3 results) No results for input(s): PROBNP in the last 8760 hours.  CBG: No results for input(s): GLUCAP in the last 168 hours. Recent Results (from the past 240 hour(s))  Resp Panel by RT-PCR (Flu A&B, Covid) Nasopharyngeal Swab     Status: None   Collection Time: 08/06/20  5:15 AM   Specimen: Nasopharyngeal Swab; Nasopharyngeal(NP) swabs in vial transport medium  Result Value Ref Range Status   SARS Coronavirus 2 by RT PCR NEGATIVE NEGATIVE Final    Comment: (NOTE) SARS-CoV-2 target nucleic acids are NOT DETECTED.  The SARS-CoV-2 RNA is generally detectable in upper respiratory specimens during the acute phase of infection. The lowest concentration of SARS-CoV-2 viral copies this assay can detect is 138 copies/mL. A negative result does not preclude SARS-Cov-2 infection and should not be used as the sole basis for treatment or other patient management decisions. A negative result may occur with  improper specimen collection/handling, submission of specimen other than nasopharyngeal swab, presence of viral mutation(s) within the areas targeted by this assay,  and inadequate number of viral copies(<138 copies/mL). A negative result must be combined with clinical observations, patient history, and epidemiological information. The expected result is Negative.  Fact Sheet for Patients:  10/05/20  Fact Sheet for Healthcare Providers:  10/06/20  This test is no t yet approved or cleared by the [employer] FDA and  has been authorized for detection and/or diagnosis of SARS-CoV-2 by FDA under an Emergency Use Authorization (EUA). This EUA will remain  in effect (meaning this test can be used) for the duration of the COVID-19 declaration under Section 564(b)(1) of the Act, 21 U.S.C.section 360bbb-3(b)(1), unless the authorization is terminated  or revoked sooner.       Influenza A by PCR NEGATIVE NEGATIVE Final   Influenza B by  PCR NEGATIVE NEGATIVE Final    Comment: (NOTE) The Xpert Xpress SARS-CoV-2/FLU/RSV plus assay is intended as an aid in the diagnosis of influenza from Nasopharyngeal swab specimens and should not be used as a sole basis for treatment. Nasal washings and aspirates are unacceptable for Xpert Xpress SARS-CoV-2/FLU/RSV testing.  Fact Sheet for Patients: BloggerCourse.com  Fact Sheet for Healthcare Providers: SeriousBroker.it  This test is not yet approved or cleared by the [place] FDA and has been authorized for detection and/or diagnosis of SARS-CoV-2 by FDA under an Emergency Use Authorization (EUA). This EUA will remain in effect (meaning this test can be used) for the duration of the COVID-19 declaration under Section 564(b)(1) of the Act, 21 U.S.C. section 360bbb-3(b)(1), unless the authorization is terminated or revoked.  Performed at [facility], [address], [place], [place] [address]      Studies: DG Abd 2 Views  Result Date: 08/08/2020 CLINICAL DATA:  Bowel obstruction  EXAM: ABDOMEN - 2 VIEW COMPARISON:  CT abdomen and pelvis August 06, 2020 FINDINGS: There are loops of dilated bowel with multiple air-fluid levels. No appreciable free air. Air is noted in nondilated ascending colon. Lung bases are clear. IMPRESSION: Findings consistent with a degree of small-bowel obstruction. Air noted in proximal ascending colon. Visualized: Not distended. No free air. Lung bases clear. Electronically Signed   By: Bretta Bang III M.D.   On: 08/08/2020 08:54      Joycelyn Das, MD  [facility] 08/08/2020  If 7PM-7AM, please contact night-coverage

## 2020-08-08 NOTE — Progress Notes (Signed)
Gastroenterology Inpatient Follow-up Note   PATIENT IDENTIFICATION  Jeanne Myers is a 50 y.o. female without significant PMH who presented with abdominal pain and findings of terminal ileitis concerning for potential Crohn's disease with SBO/partial SBO.  Remains in hospital on steroids while awaiting potential further diagnostics to rule in/out IBD. Hospital Day: 4  SUBJECTIVE  The patient's labs were reviewed today which show a progressive leukocytosis (suspect steroids but will need to be monitored). The patient's x-rays show evidence of some air into the ascending colon which suggests a partial SBO.   The patient is burping but has not passed any gas from below for more than 2 days. Patient feels that the pain medications are helpful and decrease pain significantly as long as we do not press on her abdomen. She denies fevers or chills. When discussing what she and her family member (who is on the phone) understand about the whole hospitalization, they are completely unsure of anything that is going on but they would certainly like answers because they do not understand what is going on or what the possible diagnoses even are.   OBJECTIVE  Scheduled Inpatient Medications:  . methylPREDNISolone (SOLU-MEDROL) injection  20 mg Intravenous Q8H  . nicotine  14 mg Transdermal Daily   Continuous Inpatient Infusions:  . lactated ringers 75 mL/hr at 08/08/20 1205   PRN Inpatient Medications: HYDROmorphone (DILAUDID) injection, ondansetron **OR** ondansetron (ZOFRAN) IV   Physical Examination  Temp:  [97.8 F (36.6 C)-98.3 F (36.8 C)] 97.8 F (36.6 C) (03/12 0437) Pulse Rate:  [57-61] 60 (03/12 0437) Resp:  [16] 16 (03/12 0437) BP: (120-140)/(70-83) 140/83 (03/12 0437) SpO2:  [94 %-100 %] 94 % (03/12 0437) Temp (24hrs), Avg:98.1 F (36.7 C), Min:97.8 F (36.6 C), Max:98.3 F (36.8 C)  Weight: 54.4 kg GEN: NAD, appears stated age, doesn't appear chronically ill,  nontoxic PSYCH: Cooperative, without pressured speech EYE: Conjunctivae pink, sclerae anicteric ENT: Moist mucous membranes CV: RR without R/Gs  RESP: Decreased breath sounds at the bases bilaterally GI: Hypoactive bowel sounds though borborygmi not heard, soft, protuberant abdomen, distended, tenderness to palpation in the left upper quadrant/left lower quadrant/right lower quadrant, volitional guarding present, no rebound MSK/EXT: No lower extremity edema SKIN: No jaundice NEURO:  Alert & Oriented x 3, no focal deficits   Review of Data   Laboratory Studies   Recent Labs  Lab 08/07/20 0209 08/08/20 0246  NA 134* 134*  K 4.2 4.2  CL 102 98  CO2 22 27  BUN 10 13  CREATININE 0.77 0.87  GLUCOSE 80 98  CALCIUM 8.7* 9.1  MG 1.5* 1.9  PHOS 4.2 4.7*   Recent Labs  Lab 08/07/20 0209  AST 13*  ALT 10  ALKPHOS 47    Recent Labs  Lab 08/05/20 2317 08/07/20 0209 08/08/20 0246  WBC 10.8* 10.4 15.0*  HGB 16.0* 14.5 15.9*  HCT 48.2* 42.6 46.7*  PLT 327 261 309   No results for input(s): APTT, INR in the last 168 hours.  Imaging Studies  DG Abd 2 Views  Result Date: 08/08/2020 CLINICAL DATA:  Bowel obstruction EXAM: ABDOMEN - 2 VIEW COMPARISON:  CT abdomen and pelvis August 06, 2020 FINDINGS: There are loops of dilated bowel with multiple air-fluid levels. No appreciable free air. Air is noted in nondilated ascending colon. Lung bases are clear. IMPRESSION: Findings consistent with a degree of small-bowel obstruction. Air noted in proximal ascending colon. Visualized: Not distended. No free air. Lung bases clear. Electronically Signed  By: Lowella Grip III M.D.   On: 08/08/2020 08:54   GI Procedures and Studies  No relevant studies to review   ASSESSMENT  Ms. Jeanne Myers is a 50 y.o. female without significant PMH who presented with abdominal pain and findings of terminal ileitis concerning for potential Crohn's disease with SBO/partial SBO.  Remains in hospital on  steroids while awaiting potential further diagnostics to rule in/out IBD.  The patient is hemodynamically stable.  Clinically however, is not clear that she is ready for the next diagnostic step of a colonoscopy.  We had an extensive discussion today with the patient and the patient's cousin over the phone using translation services in an effort of making sure that they both understood what was going on because it was not clear that they had a good sense of the current status of things as well as what the plan moving forward would be.  She remains on steroids and I will transition these to Solu-Medrol 60 mg daily an adequate dose in most patients who have Crohn's disease that is felt to be inflammatory reactive.  I do not think that she is ready for a bowel preparation today but now that we are seeing some air into the ascending colon, if she tolerates having clear liquids further today then tomorrow she may be an adequate candidate to begin a bowel preparation for a potential colonoscopy on Monday.  I am ordering a QuantiFERON gold test to ensure that we rule out TB which is unlikely based on not having other symptoms but needs to be considered in the differential diagnosis of terminal ileitis.  I will also send hepatitis B laboratories because if this does turn out to be Crohn's disease she may need biologic therapy so it is good to have that upfront.  After our extensive discussion, they both have a better understanding of things and are appreciative for the care that she has received up to this point in time.  All patient questions were answered to the best of my ability, and the patient agrees to the aforementioned plan of action with follow-up as indicated.   PLAN/RECOMMENDATIONS  Solu-Medrol 20 mg every 8 hour Will add on ESR/CRP and consider sending every other day to monitor clinical status Abdominal x-rays ordered for tomorrow morning Hepatitis serologies ordered QuantiFERON gold ordered If  patient does not progress or open up to the point where we can give bowel preparation for colonoscopy, then general surgery discussion for potential operative intervention will need to be considered We will reevaluate the patient on Sunday to see if she would be an adequate candidate for bowel preparation for a colonoscopy next week   [facility] will continue to monitor the patient over the weekend and Dr. Mann/Dr. Benson Norway will return on Monday.  Please page/call with questions or concerns.   Justice Britain, MD [facility] Office # [phone]  Greater than 40 minutes were spent with the patient and with the patient's cousin discussing the imaging and findings and going over history with the use of translation and discussion with the patient.    LOS: 1 day  [facility]  08/08/2020, 12:41 PM

## 2020-08-09 ENCOUNTER — Inpatient Hospital Stay (HOSPITAL_COMMUNITY): Payer: Medicaid Other

## 2020-08-09 DIAGNOSIS — K56609 Unspecified intestinal obstruction, unspecified as to partial versus complete obstruction: Secondary | ICD-10-CM

## 2020-08-09 LAB — CBC
HCT: 45.3 % (ref 36.0–46.0)
Hemoglobin: 15.6 g/dL — ABNORMAL HIGH (ref 12.0–15.0)
MCH: 31.8 pg (ref 26.0–34.0)
MCHC: 34.4 g/dL (ref 30.0–36.0)
MCV: 92.3 fL (ref 80.0–100.0)
Platelets: 286 10*3/uL (ref 150–400)
RBC: 4.91 MIL/uL (ref 3.87–5.11)
RDW: 12.8 % (ref 11.5–15.5)
WBC: 12.3 10*3/uL — ABNORMAL HIGH (ref 4.0–10.5)
nRBC: 0 % (ref 0.0–0.2)

## 2020-08-09 LAB — BASIC METABOLIC PANEL
Anion gap: 9 (ref 5–15)
BUN: 14 mg/dL (ref 6–20)
CO2: 24 mmol/L (ref 22–32)
Calcium: 8.7 mg/dL — ABNORMAL LOW (ref 8.9–10.3)
Chloride: 100 mmol/L (ref 98–111)
Creatinine, Ser: 0.73 mg/dL (ref 0.44–1.00)
GFR, Estimated: >60 mL/min (ref >60)
Glucose, Bld: 112 mg/dL — ABNORMAL HIGH (ref 70–99)
Potassium: 4.2 mmol/L (ref 3.5–5.1)
Sodium: 133 mmol/L — ABNORMAL LOW (ref 135–145)

## 2020-08-09 LAB — MAGNESIUM: Magnesium: 1.9 mg/dL (ref 1.7–2.4)

## 2020-08-09 LAB — HCV AB W REFLEX TO QUANT PCR: HCV Ab: <0.1 s/co ratio (ref 0.0–0.9)

## 2020-08-09 LAB — HEPATITIS B SURFACE ANTIBODY, QUANTITATIVE: Hep B S AB Quant (Post): 239.3 m[IU]/mL (ref >9.9)

## 2020-08-09 LAB — HEPATITIS B CORE ANTIBODY, TOTAL: Hep B Core Total Ab: NONREACTIVE

## 2020-08-09 LAB — HCV INTERPRETATION

## 2020-08-09 NOTE — Progress Notes (Signed)
   Patient Name: Jeanne Myers Date of Encounter: 08/09/2020, 4:05 PM     Patient is tolerating some clears and passing flatus but abdominal X-ray shows significant bowel dilation consistent with bowel obstruction. Patient is unlikely to tolerate bowel prep at this point, will re evaluate tomorrow  Continue IV solumedrol and supportive care  Dr Mann/Dr.Hung will resume care tomorrow   K. Scherry Ran , MD [phone]

## 2020-08-09 NOTE — Progress Notes (Signed)
PROGRESS NOTE  Jeanne Myers CBJ:[id number] DOB: Oct 27, 1970 DOA: 08/05/2020 PCP: Patient, No Pcp Per   LOS: 2 days   Brief narrative:  Jeanne Myers is a 50 y.o. female with no significant past medical history presented to hospital with 2-day history of abdominal pain mostly in the right lower quadrant without any fever chills.  In the ED, patient was noted to have mild leukocytosis.  CT scan of the abdomen and pelvis showed inflammation within the terminal ileum with focal stricture suspicious for Crohn's disease with partial SBO. Patient was treated with IV fluids, Dilaudid, and Zofran in the ED and was admitted to hospital for further evaluation and treatment.  During hospitalization, patient was seen by GI.  Patient was started on IV Solu-Medrol for potential terminal ileitis.  Assessment/Plan:  Principal Problem:   Partial small bowel obstruction (HCC) Active Problems:   Tobacco abuse   SBO (small bowel obstruction) (HCC)  Partial SBO, suspected Crohn disease CT scan with inflammation of the terminal ileum with stricture.    GI on board and patient is on IV Solu-Medrol.  States that she feels better today.  Has less pain but has tenderness on palpation.   Has had flatus but no nausea or vomiting.   Continue IV fluids,  on clears.  Follow GI recommendation.  Hepatitis panel negative.  CRP and ESR low.  Leukocytosis has trended down  Current smoker -1/2 ppd smoker, on nicotine patch.  Hypomagnesemia.  Replenished IV.  Improved to 1.9 today.  DVT prophylaxis: SCDs Start: 08/06/20 1607  Code Status: Full code  Family Communication:  None today.   Status is: Inpatient  The patient is inpatient because: Unsafe d/c plan, IV treatments appropriate due to intensity of illness or inability to take PO and Inpatient level of care appropriate due to severity of illness  Dispo: The patient is from: Home              Anticipated d/c is to: Home              Patient  currently is not medically stable to d/c.   Difficult to place patient No   Consultants:  GI  Procedures:  None  Anti-infectives:  . None  Anti-infectives (From admission, onward)   None     Subjective: Today, patient was seen and examined at bedside.  Denies any nausea vomiting or bowel movement.  Has had flatus.  States that her pain is much better today.  Has not required IV medicine  Objective: Vitals:   08/08/20 2014 08/09/20 0441  BP: 135/82 133/77  Pulse: (!) 55 (!) 55  Resp: 18 17  Temp: 98 F (36.7 C) 98.1 F (36.7 C)  SpO2: 97% 94%    Intake/Output Summary (Last 24 hours) at 08/09/2020 1153 Last data filed at 08/08/2020 1432 Gross per 24 hour  Intake 333.75 ml  Output -  Net 333.75 ml   Filed Weights   08/05/20 2308  Weight: 54.4 kg   Body mass index is 21.26 kg/m.   Physical Exam:  General:  Average built, not in obvious distress HENT:   No scleral pallor or icterus noted. Oral mucosa is moist.  Chest:  Clear breath sounds.  Diminished breath sounds bilaterally. No crackles or wheezes.  CVS: S1 &S2 heard. No murmur.  Regular rate and rhythm. Abdomen: Soft, right lower quadrant tenderness on palpation, nondistended.  Bowel sounds are heard.   Extremities: No cyanosis, clubbing or edema.  Peripheral pulses  are palpable. Psych: Alert, awake and oriented, normal mood CNS:  No cranial nerve deficits.  Power equal in all extremities.   Skin: Warm and dry.  No rashes noted.   Data Review: I have personally reviewed the following laboratory data and studies,  CBC: Recent Labs  Lab 08/05/20 2317 08/07/20 0209 08/08/20 0246 08/09/20 0325  WBC 10.8* 10.4 15.0* 12.3*  NEUTROABS  --  8.2*  --   --   HGB 16.0* 14.5 15.9* 15.6*  HCT 48.2* 42.6 46.7* 45.3  MCV 94.3 92.8 92.7 92.3  PLT 327 261 309 423   Basic Metabolic Panel: Recent Labs  Lab 08/05/20 2317 08/07/20 0209 08/08/20 0246 08/09/20 0325  NA 135 134* 134* 133*  K 3.7 4.2 4.2 4.2   CL 103 102 98 100  CO2 $Re'24 22 27 24  'FtR$ GLUCOSE 96 80 98 112*  BUN $Re'9 10 13 14  'HgD$ CREATININE 0.76 0.77 0.87 0.73  CALCIUM 9.3 8.7* 9.1 8.7*  MG  --  1.5* 1.9 1.9  PHOS  --  4.2 4.7*  --    Liver Function Tests: Recent Labs  Lab 08/05/20 2317 08/07/20 0209  AST 15 13*  ALT 13 10  ALKPHOS 54 47  BILITOT 0.8 1.0  PROT 6.4* 5.4*  ALBUMIN 3.7 3.1*   Recent Labs  Lab 08/05/20 2317  LIPASE 25   No results for input(s): AMMONIA in the last 168 hours. Cardiac Enzymes: No results for input(s): CKTOTAL, CKMB, CKMBINDEX, TROPONINI in the last 168 hours. BNP (last 3 results) No results for input(s): BNP in the last 8760 hours.  ProBNP (last 3 results) No results for input(s): PROBNP in the last 8760 hours.  CBG: No results for input(s): GLUCAP in the last 168 hours. Recent Results (from the past 240 hour(s))  Resp Panel by RT-PCR (Flu A&B, Covid) Nasopharyngeal Swab     Status: None   Collection Time: 08/06/20  5:15 AM   Specimen: Nasopharyngeal Swab; Nasopharyngeal(NP) swabs in vial transport medium  Result Value Ref Range Status   SARS Coronavirus 2 by RT PCR NEGATIVE NEGATIVE Final    Comment: (NOTE) SARS-CoV-2 target nucleic acids are NOT DETECTED.  The SARS-CoV-2 RNA is generally detectable in upper respiratory specimens during the acute phase of infection. The lowest concentration of SARS-CoV-2 viral copies this assay can detect is 138 copies/mL. A negative result does not preclude SARS-Cov-2 infection and should not be used as the sole basis for treatment or other patient management decisions. A negative result may occur with  improper specimen collection/handling, submission of specimen other than nasopharyngeal swab, presence of viral mutation(s) within the areas targeted by this assay, and inadequate number of viral copies(<138 copies/mL). A negative result must be combined with clinical observations, patient history, and epidemiological information. The expected  result is Negative.  Fact Sheet for Patients:  EntrepreneurPulse.com.au  Fact Sheet for Healthcare Providers:  IncredibleEmployment.be  This test is no t yet approved or cleared by the [place] FDA and  has been authorized for detection and/or diagnosis of SARS-CoV-2 by FDA under an Emergency Use Authorization (EUA). This EUA will remain  in effect (meaning this test can be used) for the duration of the COVID-19 declaration under Section 564(b)(1) of the Act, 21 U.S.C.section 360bbb-3(b)(1), unless the authorization is terminated  or revoked sooner.       Influenza A by PCR NEGATIVE NEGATIVE Final   Influenza B by PCR NEGATIVE NEGATIVE Final    Comment: (NOTE) The Xpert Xpress SARS-CoV-2/FLU/RSV  plus assay is intended as an aid in the diagnosis of influenza from Nasopharyngeal swab specimens and should not be used as a sole basis for treatment. Nasal washings and aspirates are unacceptable for Xpert Xpress SARS-CoV-2/FLU/RSV testing.  Fact Sheet for Patients: EntrepreneurPulse.com.au  Fact Sheet for Healthcare Providers: IncredibleEmployment.be  This test is not yet approved or cleared by the [place] FDA and has been authorized for detection and/or diagnosis of SARS-CoV-2 by FDA under an Emergency Use Authorization (EUA). This EUA will remain in effect (meaning this test can be used) for the duration of the COVID-19 declaration under Section 564(b)(1) of the Act, 21 U.S.C. section 360bbb-3(b)(1), unless the authorization is terminated or revoked.  Performed at [facility], [place] [address] [place], [place] [address]      Studies: DG Abd 2 Views  Result Date: 08/09/2020 CLINICAL DATA:  Abdominal pain EXAM: ABDOMEN - 2 VIEW COMPARISON:  Abdominal radiograph August 08, 2020; CT abdomen and pelvis August 06, 2020 FINDINGS: Supine and upright images obtained. There is bowel dilatation with  multiple air-fluid levels in a pattern consistent with a degree of bowel obstruction. No free air. There are probable small phleboliths in the pelvis. Lung bases clear. IMPRESSION: Bowel-gas pattern again consistent with a degree of bowel obstruction. No free air. Lung bases clear. Electronically Signed   By: Lowella Grip III M.D.   On: 08/09/2020 08:11   DG Abd 2 Views  Result Date: 08/08/2020 CLINICAL DATA:  Bowel obstruction EXAM: ABDOMEN - 2 VIEW COMPARISON:  CT abdomen and pelvis August 06, 2020 FINDINGS: There are loops of dilated bowel with multiple air-fluid levels. No appreciable free air. Air is noted in nondilated ascending colon. Lung bases are clear. IMPRESSION: Findings consistent with a degree of small-bowel obstruction. Air noted in proximal ascending colon. Visualized: Not distended. No free air. Lung bases clear. Electronically Signed   By: Lowella Grip III M.D.   On: 08/08/2020 08:54      Flora Lipps, MD  [facility] 08/09/2020  If 7PM-7AM, please contact night-coverage

## 2020-08-10 MED ORDER — ZOLPIDEM TARTRATE 5 MG PO TABS
5.0000 mg | ORAL_TABLET | Freq: Every evening | ORAL | Status: DC | PRN
  Administered 2020-08-10 – 2020-08-11 (×2): 5 mg via ORAL
  Filled 2020-08-10 (×2): qty 1

## 2020-08-10 MED ORDER — PEG 3350-KCL-NA BICARB-NACL 420 G PO SOLR
4000.0000 mL | Freq: Once | ORAL | Status: AC
  Administered 2020-08-10: 4000 mL via ORAL
  Filled 2020-08-10: qty 4000

## 2020-08-10 MED ORDER — LACTATED RINGERS IV SOLN: INTRAVENOUS | Status: DC

## 2020-08-10 NOTE — Progress Notes (Signed)
UNASSIGNED PATIENT Subjective: Patient is a 50-year-old Hispanic female who was admitted on 08/06/2020 with right lower quadrant pain prompted a CT scan of the abdomen pelvis that revealed inflammatory changes involving the terminal ileum with mucosal hyperemia and mild surrounding mesenteric infiltration with focal stricture 5 cm proximal to the ileocecal junction resulting in a partial small bowel obstruction.  Other areas of stricturing were not noted in the ileum as well along with a tiny fat-containing umbilical hernia.  I used the in-hospital interpreter to spike speak to the patient as she does not speak English. Patient seems to have responded well to steroids and is pain-free this afternoon.  She is anxious about going home and wants to know when her colonoscopy is going to be scheduled. Abdominal films done yesterday still showed some persistent small bowel obstruction with multiple air-fluid levels. Patient is passing flatus but has not had a BM since she was admitted.  She is tolerating clears well.  Objective: Vital signs in last 24 hours: Temp:  [97.6 F (36.4 C)-98 F (36.7 C)] 97.6 F (36.4 C) (03/13 2018) Pulse Rate:  [54-55] 54 (03/13 2018) Resp:  [16-18] 16 (03/13 2018) BP: (108-123)/(81-82) 108/81 (03/13 2018) SpO2:  [98 %-99 %] 98 % (03/13 2018) Last BM Date: 08/04/20  Intake/Output from previous day: 03/13 0701 - 03/14 0700 In: 300 [P.O.:300] Out: -  Intake/Output this shift: No intake/output data recorded.  General appearance: alert, cooperative, appears stated age, fatigued, no distress and pale Resp: clear to auscultation bilaterally Cardio: regular rate and rhythm, S1, S2 normal, no murmur, click, rub or gallop GI: soft, non-tender; bowel sounds normal; no masses,  no organomegaly Extremities: extremities normal, atraumatic, no cyanosis or edema  Lab Results: Recent Labs    08/08/20 0246 08/09/20 0325  WBC 15.0* 12.3*  HGB 15.9* 15.6*  HCT 46.7* 45.3  PLT  309 286   BMET Recent Labs    08/08/20 0246 08/09/20 0325  NA 134* 133*  K 4.2 4.2  CL 98 100  CO2 27 24  GLUCOSE 98 112*  BUN 13 14  CREATININE 0.87 0.73  CALCIUM 9.1 8.7*   Hepatitis Panel Recent Labs    08/08/20 1304  HEPBSAG NON REACTIVE   Studies/Results: DG Abd 2 Views  Result Date: 08/09/2020 CLINICAL DATA:  Abdominal pain EXAM: ABDOMEN - 2 VIEW COMPARISON:  Abdominal radiograph August 08, 2020; CT abdomen and pelvis August 06, 2020 FINDINGS: Supine and upright images obtained. There is bowel dilatation with multiple air-fluid levels in a pattern consistent with a degree of bowel obstruction. No free air. There are probable small phleboliths in the pelvis. Lung bases clear. IMPRESSION: Bowel-gas pattern again consistent with a degree of bowel obstruction. No free air. Lung bases clear. Electronically Signed   By: William  Woodruff III M.D.   On: 08/09/2020 08:11   Medications: I have reviewed the patient's current medications.  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  Further recommendation made thereafter. 2) Tiny umbilical hernia containing fat.  LOS: 3 days   Kamyiah Colantonio 08/10/2020, 12:18 PM  

## 2020-08-10 NOTE — Progress Notes (Signed)
Pt able to drink all the bowel prep. Pt stated she stayed in the bathroom for 1 hour.

## 2020-08-10 NOTE — H&P (View-Only) (Signed)
UNASSIGNED PATIENT Subjective: Patient is a 50 year old Hispanic female who was admitted on 08/06/2020 with right lower quadrant pain prompted a CT scan of the abdomen pelvis that revealed inflammatory changes involving the terminal ileum with mucosal hyperemia and mild surrounding mesenteric infiltration with focal stricture 5 cm proximal to the ileocecal junction resulting in a partial small bowel obstruction.  Other areas of stricturing were not noted in the ileum as well along with a tiny fat-containing umbilical hernia.  I used the in-hospital interpreter to spike speak to the patient as she does not speak [place]. Patient seems to have responded well to steroids and is pain-free this afternoon.  She is anxious about going home and wants to know when her colonoscopy is going to be scheduled. Abdominal films done yesterday still showed some persistent small bowel obstruction with multiple air-fluid levels. Patient is passing flatus but has not had a BM since she was admitted.  She is tolerating clears well.  Objective: Vital signs in last 24 hours: Temp:  [97.6 F (36.4 C)-98 F (36.7 C)] 97.6 F (36.4 C) (03/13 2018) Pulse Rate:  [54-55] 54 (03/13 2018) Resp:  [16-18] 16 (03/13 2018) BP: (108-123)/(81-82) 108/81 (03/13 2018) SpO2:  [98 %-99 %] 98 % (03/13 2018) Last BM Date: 08/04/20  Intake/Output from previous day: 03/13 0701 - 03/14 0700 In: 300 [P.O.:300] Out: -  Intake/Output this shift: No intake/output data recorded.  General appearance: alert, cooperative, appears stated age, fatigued, no distress and pale Resp: clear to auscultation bilaterally Cardio: regular rate and rhythm, S1, S2 normal, no murmur, click, rub or gallop GI: soft, non-tender; bowel sounds normal; no masses,  no organomegaly Extremities: extremities normal, atraumatic, no cyanosis or edema  Lab Results: Recent Labs    08/08/20 0246 08/09/20 0325  WBC 15.0* 12.3*  HGB 15.9* 15.6*  HCT 46.7* 45.3  PLT  309 286   BMET Recent Labs    08/08/20 0246 08/09/20 0325  NA 134* 133*  K 4.2 4.2  CL 98 100  CO2 27 24  GLUCOSE 98 112*  BUN 13 14  CREATININE 0.87 0.73  CALCIUM 9.1 8.7*   Hepatitis Panel Recent Labs    08/08/20 1304  HEPBSAG NON REACTIVE   Studies/Results: DG Abd 2 Views  Result Date: 08/09/2020 CLINICAL DATA:  Abdominal pain EXAM: ABDOMEN - 2 VIEW COMPARISON:  Abdominal radiograph August 08, 2020; CT abdomen and pelvis August 06, 2020 FINDINGS: Supine and upright images obtained. There is bowel dilatation with multiple air-fluid levels in a pattern consistent with a degree of bowel obstruction. No free air. There are probable small phleboliths in the pelvis. Lung bases clear. IMPRESSION: Bowel-gas pattern again consistent with a degree of bowel obstruction. No free air. Lung bases clear. Electronically Signed   By: Bretta Bang III M.D.   On: 08/09/2020 08:11   Medications: I have reviewed the patient's current medications.  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  Further recommendation made thereafter. 2) Tiny umbilical hernia containing fat.  LOS: 3 days   Charna Elizabeth 08/10/2020, 12:18 PM

## 2020-08-10 NOTE — Progress Notes (Addendum)
PROGRESS NOTE  Jeanne Myers SAY:[id number] DOB: Jan 01, 1971 DOA: 08/05/2020 PCP: Patient, No Pcp Per   LOS: 3 days   Brief narrative:  Jeanne Myers is a 50 y.o. female with no significant past medical history presented to hospital with 2-day history of abdominal pain mostly in the right lower quadrant without any fever chills.  In the ED, patient was noted to have mild leukocytosis.  CT scan of the abdomen and pelvis showed inflammation within the terminal ileum with focal stricture suspicious for Crohn's disease with partial SBO. Patient was treated with IV fluids, Dilaudid, and Zofran in the ED and was admitted to hospital for further evaluation and treatment.  After hospitalization, patient was given IV Solu-Medrol and GI closely followed the patient.  Patient persisted to have abdominal tenderness.  Abdominal x-ray showed significant bowel dilation.  Assessment/Plan:  Principal Problem:   Partial small bowel obstruction (HCC) Active Problems:   Tobacco abuse   SBO (small bowel obstruction) (HCC)  Partial SBO, suspected Crohn diseasewith stricture CT scan with inflammation of the terminal ileum with stricture.    GI on board and is on IV Solu-Medrol.   Continue IV fluids, on clears,  IV antiemetics.  She has persistent symptoms with  bowel dilatation.  Might need colonoscopic evaluation.  Further management as per GI.  Communicated with Dr. Loreta Ave GI.  Might need colonoscopy tomorrow.  Current smoker on nicotine patch.  Hypomagnesemia.  Magnesium of 1.9.  Hyponatremia.  We will continue to monitor.  DVT prophylaxis: SCDs Start: 08/06/20 2355  Code Status: Full code  Family Communication:  None  Status is: Inpatient  The patient is inpatient because: IV treatments appropriate due to intensity of illness or inability to take PO and Inpatient level of care appropriate due to severity of illness, GI evaluation, possible need for colonoscopy  Dispo: The patient is  from: Home              Anticipated d/c is to: Home              Patient currently is not medically stable to d/c.   Difficult to place patient No   Consultants:  GI  Procedures:  None  Anti-infectives:  . None  Anti-infectives (From admission, onward)   None     Subjective: Today, patient was seen and examined at bedside.  Patient complains of right lower quadrant pain requiring IV narcotics twice today.  Denies any nausea, vomiting or bowel movement.  Has been having flatus.   Objective: Vitals:   08/09/20 1516 08/09/20 2018  BP: 123/82 108/81  Pulse: (!) 55 (!) 54  Resp: 18 16  Temp: 98 F (36.7 C) 97.6 F (36.4 C)  SpO2: 99% 98%   No intake or output data in the 24 hours ending 08/10/20 1409 Filed Weights   08/05/20 2308  Weight: 54.4 kg   Body mass index is 21.26 kg/m.   Physical Exam: General:  Average built, not in obvious distress HENT:   No scleral pallor or icterus noted. Oral mucosa is moist.  Chest:  Clear breath sounds.  Diminished breath sounds bilaterally. No crackles or wheezes.  CVS: S1 &S2 heard. No murmur.  Regular rate and rhythm. Abdomen: Soft, right lower quadrant tenderness on palpation.  Nondistended.  Bowel sounds are heard.   Extremities: No cyanosis, clubbing or edema.  Peripheral pulses are palpable. Psych: Alert, awake and oriented, normal mood CNS:  No cranial nerve deficits.  Power equal in all  extremities.   Skin: Warm and dry.  No rashes noted.   Data Review: I have personally reviewed the following laboratory data and studies,  CBC: Recent Labs  Lab 08/05/20 2317 08/07/20 0209 08/08/20 0246 08/09/20 0325  WBC 10.8* 10.4 15.0* 12.3*  NEUTROABS  --  8.2*  --   --   HGB 16.0* 14.5 15.9* 15.6*  HCT 48.2* 42.6 46.7* 45.3  MCV 94.3 92.8 92.7 92.3  PLT 327 261 309 286   Basic Metabolic Panel: Recent Labs  Lab 08/05/20 2317 08/07/20 0209 08/08/20 0246 08/09/20 0325  NA 135 134* 134* 133*  K 3.7 4.2 4.2 4.2  CL  103 102 98 100  CO2 24 22 27 24   GLUCOSE 96 80 98 112*  BUN 9 10 13 14   CREATININE 0.76 0.77 0.87 0.73  CALCIUM 9.3 8.7* 9.1 8.7*  MG  --  1.5* 1.9 1.9  PHOS  --  4.2 4.7*  --    Liver Function Tests: Recent Labs  Lab 08/05/20 2317 08/07/20 0209  AST 15 13*  ALT 13 10  ALKPHOS 54 47  BILITOT 0.8 1.0  PROT 6.4* 5.4*  ALBUMIN 3.7 3.1*   Recent Labs  Lab 08/05/20 2317  LIPASE 25   No results for input(s): AMMONIA in the last 168 hours. Cardiac Enzymes: No results for input(s): CKTOTAL, CKMB, CKMBINDEX, TROPONINI in the last 168 hours. BNP (last 3 results) No results for input(s): BNP in the last 8760 hours.  ProBNP (last 3 results) No results for input(s): PROBNP in the last 8760 hours.  CBG: No results for input(s): GLUCAP in the last 168 hours. Recent Results (from the past 240 hour(s))  Resp Panel by RT-PCR (Flu A&B, Covid) Nasopharyngeal Swab     Status: None   Collection Time: 08/06/20  5:15 AM   Specimen: Nasopharyngeal Swab; Nasopharyngeal(NP) swabs in vial transport medium  Result Value Ref Range Status   SARS Coronavirus 2 by RT PCR NEGATIVE NEGATIVE Final    Comment: (NOTE) SARS-CoV-2 target nucleic acids are NOT DETECTED.  The SARS-CoV-2 RNA is generally detectable in upper respiratory specimens during the acute phase of infection. The lowest concentration of SARS-CoV-2 viral copies this assay can detect is 138 copies/mL. A negative result does not preclude SARS-Cov-2 infection and should not be used as the sole basis for treatment or other patient management decisions. A negative result may occur with  improper specimen collection/handling, submission of specimen other than nasopharyngeal swab, presence of viral mutation(s) within the areas targeted by this assay, and inadequate number of viral copies(<138 copies/mL). A negative result must be combined with clinical observations, patient history, and epidemiological information. The expected result is  Negative.  Fact Sheet for Patients:  10/05/20  Fact Sheet for Healthcare Providers:  10/06/20  This test is no t yet approved or cleared by the [employer] FDA and  has been authorized for detection and/or diagnosis of SARS-CoV-2 by FDA under an Emergency Use Authorization (EUA). This EUA will remain  in effect (meaning this test can be used) for the duration of the COVID-19 declaration under Section 564(b)(1) of the Act, 21 U.S.C.section 360bbb-3(b)(1), unless the authorization is terminated  or revoked sooner.       Influenza A by PCR NEGATIVE NEGATIVE Final   Influenza B by PCR NEGATIVE NEGATIVE Final    Comment: (NOTE) The Xpert Xpress SARS-CoV-2/FLU/RSV plus assay is intended as an aid in the diagnosis of influenza from Nasopharyngeal swab specimens and should not be  used as a sole basis for treatment. Nasal washings and aspirates are unacceptable for Xpert Xpress SARS-CoV-2/FLU/RSV testing.  Fact Sheet for Patients: [employer]  Fact Sheet for Healthcare Providers: [employer]ious[employer]ker[employer]it  This test is not yet approved or cleared by the [place] FDA and has been authorized for detection and/or diagnosis of SARS-CoV-2 by FDA under an Emergency Use Authorization (EUA). This EUA will remain in effect (meaning this test can be used) for the duration of the COVID-19 declaration under Section 564(b)(1) of the Act, 21 U.S.C. section 360bbb-3(b)(1), unless the authorization is terminated or revoked.  Performed at [facility], [address], [place], [place] [address]      Studies: DG Abd 2 Views  Result Date: 08/09/2020 CLINICAL DATA:  Abdominal pain EXAM: ABDOMEN - 2 VIEW COMPARISON:  Abdominal radiograph August 08, 2020; CT abdomen and pelvis August 06, 2020 FINDINGS: Supine and upright images obtained. There is bowel dilatation with multiple  air-fluid levels in a pattern consistent with a degree of bowel obstruction. No free air. There are probable small phleboliths in the pelvis. Lung bases clear. IMPRESSION: Bowel-gas pattern again consistent with a degree of bowel obstruction. No free air. Lung bases clear. Electronically Signed   By: Bretta Bang III M.D.   On: 08/09/2020 08:11      Joycelyn Das, MD  [facility] 08/10/2020  If 7PM-7AM, please contact night-coverage

## 2020-08-11 ENCOUNTER — Encounter (HOSPITAL_COMMUNITY)
Admission: EM | Disposition: A | Discharge status: Home / Self Care | Payer: Self-pay | Source: Home / Self Care | Attending: Internal Medicine

## 2020-08-11 ENCOUNTER — Inpatient Hospital Stay (HOSPITAL_COMMUNITY): Payer: Medicaid Other | Admitting: Anesthesiology

## 2020-08-11 ENCOUNTER — Encounter (HOSPITAL_COMMUNITY): Payer: Self-pay | Admitting: Family Medicine

## 2020-08-11 HISTORY — PX: BIOPSY: SHX5522

## 2020-08-11 HISTORY — PX: COLONOSCOPY WITH PROPOFOL: SHX5780

## 2020-08-11 HISTORY — PX: POLYPECTOMY: SHX5525

## 2020-08-11 LAB — CBC
HCT: 42.4 % (ref 36.0–46.0)
Hemoglobin: 14.4 g/dL (ref 12.0–15.0)
MCH: 32 pg (ref 26.0–34.0)
MCHC: 34 g/dL (ref 30.0–36.0)
MCV: 94.2 fL (ref 80.0–100.0)
Platelets: 263 10*3/uL (ref 150–400)
RBC: 4.5 MIL/uL (ref 3.87–5.11)
RDW: 12.5 % (ref 11.5–15.5)
WBC: 7.3 10*3/uL (ref 4.0–10.5)
nRBC: 0 % (ref 0.0–0.2)

## 2020-08-11 LAB — COMPREHENSIVE METABOLIC PANEL
ALT: 10 U/L (ref 0–44)
AST: 12 U/L — ABNORMAL LOW (ref 15–41)
Albumin: 3.1 g/dL — ABNORMAL LOW (ref 3.5–5.0)
Alkaline Phosphatase: 40 U/L (ref 38–126)
Anion gap: 8 (ref 5–15)
BUN: 13 mg/dL (ref 6–20)
CO2: 26 mmol/L (ref 22–32)
Calcium: 8.7 mg/dL — ABNORMAL LOW (ref 8.9–10.3)
Chloride: 100 mmol/L (ref 98–111)
Creatinine, Ser: 0.76 mg/dL (ref 0.44–1.00)
GFR, Estimated: >60 mL/min (ref >60)
Glucose, Bld: 111 mg/dL — ABNORMAL HIGH (ref 70–99)
Potassium: 3.7 mmol/L (ref 3.5–5.1)
Sodium: 134 mmol/L — ABNORMAL LOW (ref 135–145)
Total Bilirubin: 1.1 mg/dL (ref 0.3–1.2)
Total Protein: 5.4 g/dL — ABNORMAL LOW (ref 6.5–8.1)

## 2020-08-11 LAB — QUANTIFERON-TB GOLD PLUS: QuantiFERON-TB Gold Plus: NEGATIVE

## 2020-08-11 LAB — QUANTIFERON-TB GOLD PLUS (RQFGPL)
QuantiFERON Mitogen Value: >10 IU/mL
QuantiFERON Nil Value: 0.07 IU/mL
QuantiFERON TB1 Ag Value: 0.07 IU/mL
QuantiFERON TB2 Ag Value: 0.08 IU/mL

## 2020-08-11 LAB — MAGNESIUM: Magnesium: 1.8 mg/dL (ref 1.7–2.4)

## 2020-08-11 LAB — PHOSPHORUS: Phosphorus: 3.9 mg/dL (ref 2.5–4.6)

## 2020-08-11 SURGERY — COLONOSCOPY WITH PROPOFOL
Anesthesia: Monitor Anesthesia Care | Laterality: Left

## 2020-08-11 MED ORDER — LIDOCAINE 2% (20 MG/ML) 5 ML SYRINGE
INTRAMUSCULAR | Status: DC | PRN
  Administered 2020-08-11: 40 mg via INTRAVENOUS

## 2020-08-11 MED ORDER — PROPOFOL 10 MG/ML IV BOLUS
INTRAVENOUS | Status: DC | PRN
  Administered 2020-08-11 (×2): 20 mg via INTRAVENOUS
  Administered 2020-08-11: 30 mg via INTRAVENOUS

## 2020-08-11 MED ORDER — SODIUM CHLORIDE 0.9 % IV SOLN: INTRAVENOUS | Status: DC

## 2020-08-11 MED ORDER — PROPOFOL 500 MG/50ML IV EMUL
INTRAVENOUS | Status: DC | PRN
  Administered 2020-08-11: 75 ug/kg/min via INTRAVENOUS

## 2020-08-11 MED ORDER — ACETAMINOPHEN 325 MG PO TABS
650.0000 mg | ORAL_TABLET | Freq: Four times a day (QID) | ORAL | Status: DC | PRN
  Administered 2020-08-11: 650 mg via ORAL
  Filled 2020-08-11: qty 2

## 2020-08-11 SURGICAL SUPPLY — 22 items

## 2020-08-11 NOTE — Op Note (Signed)
[facility] Patient Name: Jeanne Myers Procedure Date : 08/11/2020 MRN: [id number] Attending MD: Jeani HawkingPatrick Hung , MD Date of Birth: 1971/04/18 CSN: [id number] Age: 50 Admit Type: Inpatient Procedure:                Colonoscopy Indications:              Abnormal CT of the GI tract Providers:                Jeani HawkingPatrick Hung, MD, [place] BailiffMaggie Chrismon, RN, Rosilyn MingsNichole                            Montalvo, Technician Referring MD:              Medicines:                Propofol per Anesthesia Complications:            No immediate complications. Estimated Blood Loss:     Estimated blood loss: none. Procedure:                Pre-Anesthesia Assessment:                           - Prior to the procedure, a History and Physical                            was performed, and patient medications and                            allergies were reviewed. The patient's tolerance of                            previous anesthesia was also reviewed. The risks                            and benefits of the procedure and the sedation                            options and risks were discussed with the patient.                            All questions were answered, and informed consent                            was obtained. Prior Anticoagulants: The patient has                            taken no previous anticoagulant or antiplatelet                            agents. ASA Grade Assessment: II - A patient with                            mild systemic disease. After reviewing the risks  and benefits, the patient was deemed in                            satisfactory condition to undergo the procedure.                           - Sedation was administered by an anesthesia                            professional. Deep sedation was attained.                           After obtaining informed consent, the colonoscope                            was passed under direct vision.  Throughout the                            procedure, the patient's blood pressure, pulse, and                            oxygen saturations were monitored continuously. The                            CF-HQ190L ([id number] Olympus colonoscope was                            introduced through the anus and advanced to the the                            terminal ileum. The colonoscopy was somewhat                            difficult due to significant looping. Successful                            completion of the procedure was aided by using                            manual pressure and straightening and shortening                            the scope to obtain bowel loop reduction. The                            patient tolerated the procedure well. The quality                            of the bowel preparation was good. The ileocecal                            valve, appendiceal orifice, and rectum were  photographed. Scope In: 11:58:52 AM Scope Out: 12:19:41 PM Scope Withdrawal Time: 0 hours 16 minutes 5 seconds  Total Procedure Duration: 0 hours 20 minutes 49 seconds  Findings:      A 3 mm polyp was found in the transverse colon. The polyp was sessile.       The polyp was removed with a cold snare. Resection and retrieval were       complete.      The terminal ileum appeared normal. Biopsies were taken with a cold       forceps for histology.      Approximately 10-20 cm of the TI was examined. There was no evidence of       any inflammation. The mucosa was completely normal. It may be that the       abnormal area was more proximal then the very distal portion of the       examined TI. Impression:               - One 3 mm polyp in the transverse colon, removed                            with a cold snare. Resected and retrieved.                           - The examined portion of the ileum was normal.                            Biopsied. Recommendation:            - Return patient to hospital ward for ongoing care.                           - Resume regular diet.                           - Continue present medications.                           - Prednisone 40 mg QD.                           - If tolerating a regular diet today she can be                            discharged home tomorrow.                           - Follow up in the office in two weeks. Procedure Code(s):        --- Professional ---                           [phone], Colonoscopy, flexible; with removal of                            tumor(s), polyp(s), or other lesion(s) by snare  technique                           45380, 59, Colonoscopy, flexible; with biopsy,                            single or multiple Diagnosis Code(s):        --- Professional ---                           K63.5, Polyp of colon                           R93.3, Abnormal findings on diagnostic imaging of                            other parts of digestive tract CPT copyright 2019 American Medical Association. All rights reserved. The codes documented in this report are preliminary and upon coder review may  be revised to meet current compliance requirements. Jeani Hawking, MD Jeani Hawking, MD 08/11/2020 12:31:20 PM This report has been signed electronically. Number of Addenda: 0

## 2020-08-11 NOTE — Progress Notes (Signed)
Patient to endo at approximately 1100; returned to unit, per report patient tolerated procedure well. New bag of LR hung and started at 75 ml/hr per order. Patient asked for dilaudid but I held due to HR 42; Pokhrel, MD notified of HR. Resumed patient back to previous diet.

## 2020-08-11 NOTE — Transfer of Care (Signed)
Immediate Anesthesia Transfer of Care Note  Patient: Jeanne Myers  Procedure(s) Performed: COLONOSCOPY WITH PROPOFOL (Left )  Patient Location: PACU and Endoscopy Unit  Anesthesia Type:MAC  Level of Consciousness: awake, alert  and oriented  Airway & Oxygen Therapy: Patient Spontanous Breathing  Post-op Assessment: Report given to RN and Post -op Vital signs reviewed and stable  Post vital signs: Reviewed and stable  Last Vitals:  Vitals Value Taken Time  BP    Temp    Pulse    Resp    SpO2      Last Pain:  Vitals:   08/11/20 1111  TempSrc: Temporal  PainSc: 0-No pain         Complications: No complications documented.

## 2020-08-11 NOTE — Progress Notes (Signed)
PROGRESS NOTE  Jeanne Myers UXL:[id number] DOB: Dec 18, 1970 DOA: 08/05/2020 PCP: Patient, No Pcp Per   LOS: 4 days   Brief narrative:  Jeanne Myers is a 50 y.o. female with no significant past medical history presented to hospital with 2-day history of abdominal pain mostly in the right lower quadrant without any fever chills.  In the ED, patient was noted to have mild leukocytosis.  CT scan of the abdomen and pelvis showed inflammation within the terminal ileum with focal stricture suspicious for Crohn's disease with partial SBO. Patient was treated with IV fluids, Dilaudid, and Zofran in the ED and was admitted to hospital for further evaluation and treatment.  During hospitalization, patient was seen by GI.  Patient was started on IV Solu-Medrol for potential terminal ileitis.  Patient persisted to have lower quadrant pain so colonoscopy was planned  Assessment/Plan:  Principal Problem:   Partial small bowel obstruction (HCC) Active Problems:   Tobacco abuse   SBO (small bowel obstruction) (HCC)  Partial SBO, suspected Crohn disease CT scan with inflammation of the terminal ileum with stricture.    GI on board and patient is on IV Solu-Medrol.  Patient still has tenderness over the right lower quadrant and plan is colonoscopic evaluation today.  Underwent bowel prep patient which she tolerated.  Hepatitis panel negative.  CRP and ESR low.  Leukocytosis has trended down  Current smoker -1/2 ppd smoker, on nicotine patch.  Hypomagnesemia.  Replenished IV.  Improved  DVT prophylaxis: SCDs Start: 08/06/20 5366  Code Status: Full code  Family Communication:  I tried to reach the significant other and daughter on the phone listed but was unable to reach them  Status is: Inpatient  The patient is inpatient because: Unsafe d/c plan, IV treatments appropriate due to intensity of illness or inability to take PO and Inpatient level of care appropriate due to severity of  illness, plan for colonoscopic evaluation.  Dispo: The patient is from: Home              Anticipated d/c is to: Home              Patient currently is not medically stable to d/c.   Difficult to place patient No   Consultants:  GI  Procedures:  None yet  Anti-infectives:  . None  Anti-infectives (From admission, onward)   None     Subjective: Today, patient was seen and examined at bedside.  Denies any nausea vomiting but pain over the right lower quadrant.  Has been doing bowel preparation  Objective: Vitals:   08/10/20 2032 08/11/20 0603  BP: 123/86 (!) 147/71  Pulse: (!) 48 (!) 47  Resp:  16  Temp: 97.6 F (36.4 C) 98 F (36.7 C)  SpO2: 99% 98%    Intake/Output Summary (Last 24 hours) at 08/11/2020 1111 Last data filed at 08/11/2020 0759 Gross per 24 hour  Intake 4895.3 ml  Output -  Net 4895.3 ml   Filed Weights   08/05/20 2308  Weight: 54.4 kg   Body mass index is 21.26 kg/m.   Physical Exam: General:  Average built, not in obvious distress HENT:   No scleral pallor or icterus noted. Oral mucosa is moist.  Chest:  Clear breath sounds.  Diminished breath sounds bilaterally. No crackles or wheezes.  CVS: S1 &S2 heard. No murmur.  Regular rate and rhythm. Abdomen: Soft, right lower quadrant tenderness noted, nondistended.  Bowel sounds are heard.   Extremities: No cyanosis,  clubbing or edema.  Peripheral pulses are palpable. Psych: Alert, awake and oriented, normal mood CNS:  No cranial nerve deficits.  Power equal in all extremities.   Skin: Warm and dry.  No rashes noted.  Data Review: I have personally reviewed the following laboratory data and studies,  CBC: Recent Labs  Lab 08/05/20 2317 08/07/20 0209 08/08/20 0246 08/09/20 0325 08/11/20 0310  WBC 10.8* 10.4 15.0* 12.3* 7.3  NEUTROABS  --  8.2*  --   --   --   HGB 16.0* 14.5 15.9* 15.6* 14.4  HCT 48.2* 42.6 46.7* 45.3 42.4  MCV 94.3 92.8 92.7 92.3 94.2  PLT 327 261 309 286 024    Basic Metabolic Panel: Recent Labs  Lab 08/05/20 2317 08/07/20 0209 08/08/20 0246 08/09/20 0325 08/11/20 0310  NA 135 134* 134* 133* 134*  K 3.7 4.2 4.2 4.2 3.7  CL 103 102 98 100 100  CO2 $Re'24 22 27 24 26  'GIW$ GLUCOSE 96 80 98 112* 111*  BUN $Re'9 10 13 14 13  'mer$ CREATININE 0.76 0.77 0.87 0.73 0.76  CALCIUM 9.3 8.7* 9.1 8.7* 8.7*  MG  --  1.5* 1.9 1.9 1.8  PHOS  --  4.2 4.7*  --  3.9   Liver Function Tests: Recent Labs  Lab 08/05/20 2317 08/07/20 0209 08/11/20 0310  AST 15 13* 12*  ALT $Re'13 10 10  'lGb$ ALKPHOS 54 47 40  BILITOT 0.8 1.0 1.1  PROT 6.4* 5.4* 5.4*  ALBUMIN 3.7 3.1* 3.1*   Recent Labs  Lab 08/05/20 2317  LIPASE 25   No results for input(s): AMMONIA in the last 168 hours. Cardiac Enzymes: No results for input(s): CKTOTAL, CKMB, CKMBINDEX, TROPONINI in the last 168 hours. BNP (last 3 results) No results for input(s): BNP in the last 8760 hours.  ProBNP (last 3 results) No results for input(s): PROBNP in the last 8760 hours.  CBG: No results for input(s): GLUCAP in the last 168 hours. Recent Results (from the past 240 hour(s))  Resp Panel by RT-PCR (Flu A&B, Covid) Nasopharyngeal Swab     Status: None   Collection Time: 08/06/20  5:15 AM   Specimen: Nasopharyngeal Swab; Nasopharyngeal(NP) swabs in vial transport medium  Result Value Ref Range Status   SARS Coronavirus 2 by RT PCR NEGATIVE NEGATIVE Final    Comment: (NOTE) SARS-CoV-2 target nucleic acids are NOT DETECTED.  The SARS-CoV-2 RNA is generally detectable in upper respiratory specimens during the acute phase of infection. The lowest concentration of SARS-CoV-2 viral copies this assay can detect is 138 copies/mL. A negative result does not preclude SARS-Cov-2 infection and should not be used as the sole basis for treatment or other patient management decisions. A negative result may occur with  improper specimen collection/handling, submission of specimen other than nasopharyngeal swab, presence of  viral mutation(s) within the areas targeted by this assay, and inadequate number of viral copies(<138 copies/mL). A negative result must be combined with clinical observations, patient history, and epidemiological information. The expected result is Negative.  Fact Sheet for Patients:  EntrepreneurPulse.com.au  Fact Sheet for Healthcare Providers:  IncredibleEmployment.be  This test is no t yet approved or cleared by the [place] FDA and  has been authorized for detection and/or diagnosis of SARS-CoV-2 by FDA under an Emergency Use Authorization (EUA). This EUA will remain  in effect (meaning this test can be used) for the duration of the COVID-19 declaration under Section 564(b)(1) of the Act, 21 U.S.C.section 360bbb-3(b)(1), unless the authorization is terminated  or revoked sooner.       Influenza A by PCR NEGATIVE NEGATIVE Final   Influenza B by PCR NEGATIVE NEGATIVE Final    Comment: (NOTE) The Xpert Xpress SARS-CoV-2/FLU/RSV plus assay is intended as an aid in the diagnosis of influenza from Nasopharyngeal swab specimens and should not be used as a sole basis for treatment. Nasal washings and aspirates are unacceptable for Xpert Xpress SARS-CoV-2/FLU/RSV testing.  Fact Sheet for Patients: EntrepreneurPulse.com.au  Fact Sheet for Healthcare Providers: IncredibleEmployment.be  This test is not yet approved or cleared by the [place] FDA and has been authorized for detection and/or diagnosis of SARS-CoV-2 by FDA under an Emergency Use Authorization (EUA). This EUA will remain in effect (meaning this test can be used) for the duration of the COVID-19 declaration under Section 564(b)(1) of the Act, 21 U.S.C. section 360bbb-3(b)(1), unless the authorization is terminated or revoked.  Performed at [facility], [place] [address], [place], [place] [address]      Studies: No results  found.    Flora Lipps, MD  [facility] 08/11/2020  If 7PM-7AM, please contact night-coverage

## 2020-08-11 NOTE — Discharge Instructions (Signed)
YOU HAD AN ENDOSCOPIC PROCEDURE TODAY: Refer to the procedure report and other information in the discharge instructions given to you for any specific questions about what was found during the examination. If this information does not answer your questions, please call [facility] office at [phone] to clarify.   YOU SHOULD EXPECT: Some feelings of bloating in the abdomen. Passage of more gas than usual. Walking can help get rid of the air that was put into your GI tract during the procedure and reduce the bloating. If you had a lower endoscopy (such as a colonoscopy or flexible sigmoidoscopy) you may notice spotting of blood in your stool or on the toilet paper. Some abdominal soreness may be present for a day or two, also.  DIET: Your first meal following the procedure should be a light meal and then it is ok to progress to your normal diet. A half-sandwich or bowl of soup is an example of a good first meal. Heavy or fried foods are harder to digest and may make you feel nauseous or bloated. Drink plenty of fluids but you should avoid alcoholic beverages for 24 hours. If you had a esophageal dilation, please see attached instructions for diet.    ACTIVITY: Your care partner should take you home directly after the procedure. You should plan to take it easy, moving slowly for the rest of the day. You can resume normal activity the day after the procedure however YOU SHOULD NOT DRIVE, use power tools, machinery or perform tasks that involve climbing or major physical exertion for 24 hours (because of the sedation medicines used during the test).   SYMPTOMS TO REPORT IMMEDIATELY: A gastroenterologist can be reached at any hour. Please call [phone]  for any of the following symptoms:  . Following lower endoscopy (colonoscopy, flexible sigmoidoscopy) Excessive amounts of blood in the stool  Significant tenderness, worsening of abdominal pains  Swelling of the abdomen that is new, acute  Fever of 100  or higher  . Following upper endoscopy (EGD, EUS, ERCP, esophageal dilation) Vomiting of blood or coffee ground material  New, significant abdominal pain  New, significant chest pain or pain under the shoulder blades  Painful or persistently difficult swallowing  New shortness of breath  Black, tarry-looking or red, bloody stools  FOLLOW UP:  If any biopsies were taken you will be contacted by phone or by letter within the next 1-3 weeks. Call [phone]  if you have not heard about the biopsies in 3 weeks.  Please also call with any specific questions about appointments or follow up tests.Biopsia de colon, cuidados posteriores Colon Biopsy, Care After Esta hoja le brinda informacin sobre cmo cuidarse despus del procedimiento. Su mdico tambin podr darle instrucciones ms especficas. Comunquese con el mdico si tiene problemas o preguntas. Qu puedo esperar despus del procedimiento? Despus del procedimiento, es normal tener los siguientes sntomas:  Una pequea cantidad de sangre en las heces (materia fecal) durante 24horas despus del procedimiento.  Gases.  Clicos o distensin leves en el abdomen. Siga estas instrucciones en su casa: Durante el perodo de [employer] le haya indicado el mdico:  Descanse.  No participe en actividades que impliquen posibles cadas o lesiones.  No conduzca ni opere maquinaria.  No beba alcohol.  No tome somnferos ni medicamentos que causen somnolencia.  No tome decisiones trascendentes ni firme documentos importantes.  No cuide a nios por su cuenta.  Realice las actividades cotidianas habituales a un ritmo ms lento que el normal.  Coma  alimentos blandos y fciles de Location manager. Comida y bebida  Beba suficiente lquido como para Pharmacologist la orina de color amarillo plido.  Reanude su dieta normal como se lo haya indicado el mdico. Evite los alimentos pesados o fritos que son difciles de Location manager.  Evite consumir alcohol  durante el tiempo que le haya indicado el mdico. Alivio de los clicos y de la distensin  Cuando tenga clicos o se sienta hinchado, intente caminar por la casa.  Si se lo indican, aplique calor en el abdomen con la frecuencia que le haya indicado el mdico. Use la fuente de calor que el mdico le recomiende, como una compresa de calor hmedo o una almohadilla trmica. ? Coloque una [employer] piel y la fuente de Airline pilot. ? Aplique calor durante 20 a . ? Retire la fuente de calor si la piel se pone de color rojo brillante. Esto es especialmente importante si no puede sentir dolor, calor o fro. Corre un mayor riesgo de sufrir quemaduras. ? No duerma con la fuente de calor colocada.   Indicaciones generales  Use los medicamentos de venta libre o los recetados solamente como se lo haya indicado el mdico.  Cumpla con todas las visitas de seguimiento. Esto es importante. Comunquese con un mdico si:  Tiene sangre en las heces 2 o 3das despus del procedimiento. Solicite ayuda de inmediato si:  Tiene ms que una pequea mancha de [employer].  Elimina grandes cogulos de [employer].  Tiene ms hinchazn o distensin en el abdomen.  Tiene nuseas o vmitos.  Tiene fiebre.  Tiene un aumento del Engineer, mining en el abdomen que no se alivia con los medicamentos. Resumen  Despus del procedimiento, es frecuente sentir clicos y distensin leves en el abdomen.  No conduzca ni use maquinaria durante el perodo de [employer] haya indicado el mdico.  Cuando tenga clicos o se sienta hinchado, intente caminar por la casa. Esta informacin no tiene Theme park manager el consejo del mdico. Asegrese de hacerle al mdico cualquier pregunta que tenga. Document Revised: 11/01/2019 Document Reviewed: 11/01/2019 Elsevier Patient Education  2021 [employer].

## 2020-08-11 NOTE — Anesthesia Preprocedure Evaluation (Signed)
Anesthesia Evaluation  Patient identified by MRN, date of birth, ID band Patient awake    Reviewed: Allergy & Precautions, NPO status , Patient's Chart, lab work & pertinent test results  Airway Mallampati: II  TM Distance: >3 FB Neck ROM: Full    Dental  (+) Teeth Intact, Dental Advisory Given   Pulmonary Current Smoker and Patient abstained from smoking.,    Pulmonary exam normal breath sounds clear to auscultation       Cardiovascular negative cardio ROS Normal cardiovascular exam Rhythm:Regular Rate:Normal     Neuro/Psych negative neurological ROS  negative psych ROS   GI/Hepatic Neg liver ROS,  terminal ileitis rule out Crohn's disease   Endo/Other  negative endocrine ROS  Renal/GU negative Renal ROS     Musculoskeletal negative musculoskeletal ROS (+)   Abdominal   Peds  Hematology negative hematology ROS (+)   Anesthesia Other Findings Day of surgery medications reviewed with the patient.  Reproductive/Obstetrics                             Anesthesia Physical Anesthesia Plan  ASA: II  Anesthesia Plan: MAC   Post-op Pain Management:    Induction: Intravenous  PONV Risk Score and Plan: 1 and Propofol infusion and Treatment may vary due to age or medical condition  Airway Management Planned: Nasal Cannula and Natural Airway  Additional Equipment:   Intra-op Plan:   Post-operative Plan:   Informed Consent: I have reviewed the patients History and Physical, chart, labs and discussed the procedure including the risks, benefits and alternatives for the proposed anesthesia with the patient or authorized representative who has indicated his/her understanding and acceptance.     Dental advisory given and Interpreter used for interveiw  Plan Discussed with: CRNA and Anesthesiologist  Anesthesia Plan Comments:         Anesthesia Quick Evaluation

## 2020-08-11 NOTE — Anesthesia Procedure Notes (Signed)
Procedure Name: MAC Date/Time: 08/11/2020 11:52 AM Performed by: Trinna Post., CRNA Pre-anesthesia Checklist: Patient identified, Emergency Drugs available, Suction available, Patient being monitored and Timeout performed Patient Re-evaluated:Patient Re-evaluated prior to induction Oxygen Delivery Method: Nasal cannula Preoxygenation: Pre-oxygenation with 100% oxygen Induction Type: IV induction Placement Confirmation: positive ETCO2

## 2020-08-11 NOTE — Anesthesia Postprocedure Evaluation (Signed)
Anesthesia Post Note  Patient: Jeanne Myers  Procedure(s) Performed: COLONOSCOPY WITH PROPOFOL (Left )     Patient location during evaluation: PACU Anesthesia Type: MAC Level of consciousness: awake and alert Pain management: pain level controlled Vital Signs Assessment: post-procedure vital signs reviewed and stable Respiratory status: spontaneous breathing, nonlabored ventilation, respiratory function stable and patient connected to nasal cannula oxygen Cardiovascular status: stable and blood pressure returned to baseline Postop Assessment: no apparent nausea or vomiting Anesthetic complications: no   No complications documented.  Last Vitals:  Vitals:   08/11/20 1240 08/11/20 1250  BP:    Pulse: (!) 105 (!) 46  Resp: (!) 30 17  Temp:    SpO2: 97% 100%    Last Pain:  Vitals:   08/11/20 1231  TempSrc: Temporal  PainSc: 0-No pain                 Catalina Gravel

## 2020-08-11 NOTE — Interval H&P Note (Signed)
History and Physical Interval Note:  08/11/2020 11:43 AM  Jeanne Myers  has presented today for surgery, with the diagnosis of terminal ileitis rule out Crohn's disease.  The various methods of treatment have been discussed with the patient and family. After consideration of risks, benefits and other options for treatment, the patient has consented to  Procedure(s) with comments: COLONOSCOPY WITH PROPOFOL (Left) - terminal ileitis rule out Crohn's disease as a surgical intervention.  The patient's history has been reviewed, patient examined, no change in status, stable for surgery.  I have reviewed the patient's chart and labs.  Questions were answered to the patient's satisfaction.     [name]

## 2020-08-12 LAB — BASIC METABOLIC PANEL
Anion gap: 8 (ref 5–15)
BUN: 17 mg/dL (ref 6–20)
CO2: 25 mmol/L (ref 22–32)
Calcium: 8.8 mg/dL — ABNORMAL LOW (ref 8.9–10.3)
Chloride: 103 mmol/L (ref 98–111)
Creatinine, Ser: 0.67 mg/dL (ref 0.44–1.00)
GFR, Estimated: >60 mL/min (ref >60)
Glucose, Bld: 135 mg/dL — ABNORMAL HIGH (ref 70–99)
Potassium: 3.9 mmol/L (ref 3.5–5.1)
Sodium: 136 mmol/L (ref 135–145)

## 2020-08-12 LAB — CBC
HCT: 43 % (ref 36.0–46.0)
Hemoglobin: 15 g/dL (ref 12.0–15.0)
MCH: 32 pg (ref 26.0–34.0)
MCHC: 34.9 g/dL (ref 30.0–36.0)
MCV: 91.7 fL (ref 80.0–100.0)
Platelets: 278 10*3/uL (ref 150–400)
RBC: 4.69 MIL/uL (ref 3.87–5.11)
RDW: 12.6 % (ref 11.5–15.5)
WBC: 8.2 10*3/uL (ref 4.0–10.5)
nRBC: 0 % (ref 0.0–0.2)

## 2020-08-12 LAB — SURGICAL PATHOLOGY

## 2020-08-12 LAB — MAGNESIUM: Magnesium: 1.9 mg/dL (ref 1.7–2.4)

## 2020-08-12 MED ORDER — PANTOPRAZOLE SODIUM 40 MG PO TBEC: 40.0000 mg | DELAYED_RELEASE_TABLET | Freq: Every day | ORAL | 1 refills | Status: AC

## 2020-08-12 MED ORDER — ACETAMINOPHEN 325 MG PO TABS: 325.0000 mg | ORAL_TABLET | Freq: Four times a day (QID) | ORAL | Status: AC | PRN

## 2020-08-12 MED ORDER — ONDANSETRON HCL 4 MG PO TABS: 4.0000 mg | ORAL_TABLET | Freq: Four times a day (QID) | ORAL | 0 refills | Status: AC | PRN

## 2020-08-12 MED ORDER — PREDNISONE 10 MG PO TABS: 40.0000 mg | ORAL_TABLET | Freq: Every day | ORAL | 0 refills | Status: AC

## 2020-08-12 MED ORDER — HYDROCODONE-ACETAMINOPHEN 5-325 MG PO TABS: 1.0000 | ORAL_TABLET | Freq: Four times a day (QID) | ORAL | 0 refills | Status: AC | PRN

## 2020-08-12 MED ORDER — NICOTINE 14 MG/24HR TD PT24: 14.0000 mg | MEDICATED_PATCH | Freq: Every day | TRANSDERMAL | 0 refills | Status: AC

## 2020-08-12 NOTE — Discharge Summary (Signed)
Physician Discharge Summary  Jeanne Myers OIT:[id number] DOB: Sep 07, 1970 DOA: 08/05/2020  PCP: Patient, No Pcp Per  Admit date: 08/05/2020 Discharge date: 08/12/2020  Admitted From: Home  Discharge disposition: Home   Recommendations for Outpatient Follow-Up:   . Follow up with your primary care provider in one week.  . Check CBC, BMP, magnesium in the next visit . Follow-up with [facility] as outpatient in 2 weeks.   Discharge Diagnosis:   Principal Problem:   Partial small bowel obstruction (HCC) Active Problems:   Tobacco abuse   SBO (small bowel obstruction) ([place])   Discharge Condition: Improved.  Diet recommendation: Low sodium, heart healthy.  Carbohydrate-modified.  Regular.  Wound care: None.  Code status: Full.  History of Present Illness:   Jeanne Myers a 50 y.o.female with no significant past medical history presented to hospital with 2-day history of abdominal pain mostly in the right lower quadrant without any fever chills.  In the ED, patient was noted to have mild leukocytosis.  CT scan of the abdomen and pelvis showed inflammation within the terminal ileum with focal stricture suspicious for Crohn's disease with partial SBO.Patient was treated with IV fluids, Dilaudid, and Zofran in the ED and was admitted to hospital for further evaluation and treatment. During hospitalization, patient was seen by GI.  Patient was started on IV Solu-Medrol for potential terminal ileitis.  Patient persisted to have lower quadrant pain so colonoscopy was performed.   Hospital Course:   Following conditions were addressed during hospitalization as listed below,  Partial small bowel obstruction secondary to suspected Crohn disease CT scan with inflammation of the terminal ileum with stricture.   GI was consulted and patient was put empirically on IV Solu-Medrol.  She subsequently underwent colonoscopy but there was no finding of ileal region inflammation.   Patient was subsequently advanced on diet.  I spoke with Dr. Benson Norway GI who recommended prednisone 40 mg p.o. daily until his follow-up as outpatient with GI.  Hepatitis panel was negative.  CRP and ESR was low.  Leukocytosis trended down.  Current smoker -1/2 ppd smoker, on nicotine patch.  Hypomagnesemia.  Replenished via IV.  Improved  Disposition.  At this time, patient is stable for disposition home with outpatient GI follow-up.  Spoke with the patient's family prior to disposition  Medical Consultants:    GI  Procedures:    Colonoscopy on 08/11/2020 Subjective:   Today, patient was seen and examined at bedside.  Ambulating in the hallway.  Has mild lower abdominal pain.  No nausea or vomiting or fever.  Has had bowel movements.  Discharge Exam:   Vitals:   08/11/20 2135 08/12/20 0504  BP: 128/76 134/87  Pulse: 62 (!) 53  Resp: 18 17  Temp: 98.6 F (37 C) 98.1 F (36.7 C)  SpO2: 98% 98%   Vitals:   08/11/20 1250 08/11/20 1406 08/11/20 2135 08/12/20 0504  BP:  (!) 185/71 128/76 134/87  Pulse: (!) 46 (!) 42 62 (!) 53  Resp: _0 Temp:  97.7 F (36.5 C) 98.6 F (37 C) 98.1 F (36.7 C)  TempSrc:  Oral    SpO2: 100% 99% 98% 98%  Weight:      Height:       General: Alert awake, not in obvious distress HENT: pupils equally reacting to light,  No scleral pallor or icterus noted. Oral mucosa is moist.  Chest:  Clear breath sounds.  Diminished breath sounds bilaterally. No crackles  or wheezes.  CVS: S1 &S2 heard. No murmur.  Regular rate and rhythm. Abdomen: Soft, mild tenderness over the right lower quadrant, nondistended.  Bowel sounds are heard.   Extremities: No cyanosis, clubbing or edema.  Peripheral pulses are palpable. Psych: Alert, awake and oriented, normal mood CNS:  No cranial nerve deficits.  Power equal in all extremities.   Skin: Warm and dry.  No rashes noted.  The results of significant diagnostics from this hospitalization (including  imaging, microbiology, ancillary and laboratory) are listed below for reference.     Diagnostic Studies:   CT ABDOMEN PELVIS W CONTRAST  Result Date: 08/06/2020 CLINICAL DATA:  Diffuse abdominal pain, hematuria EXAM: CT ABDOMEN AND PELVIS WITH CONTRAST TECHNIQUE: Multidetector CT imaging of the abdomen and pelvis was performed using the standard protocol following bolus administration of intravenous contrast. CONTRAST:  134m OMNIPAQUE IOHEXOL 300 MG/ML  SOLN COMPARISON:  None. FINDINGS: Lower chest: Visualized lung bases are clear bilaterally. The visualized heart and pericardium are unremarkable. Hepatobiliary: Scattered hypodensities are seen throughout the right hepatic lobe which are too small to accurately characterize but likely represent multiple small cysts or hemangioma in a patient without a history of malignancy. The liver is otherwise unremarkable. No intra or extrahepatic biliary ductal dilation. Gallbladder unremarkable. Pancreas: Unremarkable Spleen: Unremarkable Adrenals/Urinary Tract: The adrenal glands are unremarkable. The kidneys are normal in size and position. Simple cortical cyst noted within the interpolar region of the right kidney. The kidneys are otherwise unremarkable. Bladder unremarkable. Stomach/Bowel: There is inflammatory change involving the terminal ileum with mucosal hyperemia, mild surrounding mesenteric infiltration, and a a focal stricture identified roughly 5 cm proximal to the ileocecal junction resulting in a partial small bowel obstruction. Additionally, there is a separate region of hyperemia and bowel wall thickening seen approximately within the ileum, best noted on coronal image # 22/5. More proximally, the small bowel appears fluid-filled with fecalized intraluminal contents. Together, these geographically separate regions of inflammation with stricture ring noted in the region of the ileocecal junction are compatible with Crohn's disease. Less likely, this can  be seen with atypical infection, including mycobacterial ileitis. The stomach, small bowel, and large bowel are otherwise unremarkable. Appendix normal. Mild free fluid within the right lower quadrant of the abdomen adjacent to the area of maximal inflammation. No free intraperitoneal gas. Vascular/Lymphatic: The abdominal vasculature is unremarkable. No pathologic adenopathy within the abdomen and pelvis. Reproductive: Uterus and bilateral adnexa are unremarkable. Other: Tiny fat containing umbilical hernia. Musculoskeletal: No acute bone abnormality. No lytic or blastic bone lesion. IMPRESSION: Multiple, geographically distinct areas of inflammation within the terminal ileum with focal stricturing noted just proximal to the ileocecal junction suggestive of inflammatory bowel disease/Crohn's enteritis. Resultant partial small bowel obstruction. No evidence of perforation. No intra-abdominal abscess formation. Electronically Signed   By: AFidela SalisburyMD   On: 08/06/2020 04:53     Labs:   Basic Metabolic Panel: Recent Labs  Lab 08/07/20 0295203/12/22 0246 08/09/20 0325 08/11/20 0310 08/12/20 0242  NA 134* 134* 133* 134* 136  K 4.2 4.2 4.2 3.7 3.9  CL 102 98 100 100 103  CO2 _0 GLUCOSE 80 98 112* 111* 135*  BUN _1 CREATININE 0.77 0.87 0.73 0.76 0.67  CALCIUM 8.7* 9.1 8.7* 8.7* 8.8*  MG 1.5* 1.9 1.9 1.8 1.9  PHOS 4.2 4.7*  --  3.9  --    GFR Estimated Creatinine Clearance: 70.4 mL/min (by C-G formula  based on SCr of 0.67 mg/dL). Liver Function Tests: Recent Labs  Lab 08/05/20 2317 08/07/20 0209 08/11/20 0310  AST 15 13* 12*  ALT _0 ALKPHOS 54 47 40  BILITOT 0.8 1.0 1.1  PROT 6.4* 5.4* 5.4*  ALBUMIN 3.7 3.1* 3.1*   Recent Labs  Lab 08/05/20 2317  LIPASE 25   No results for input(s): AMMONIA in the last 168 hours. Coagulation profile No results for input(s): INR, PROTIME in the last 168 hours.  CBC: Recent Labs  Lab 08/07/20 0209  08/08/20 0246 08/09/20 0325 08/11/20 0310 08/12/20 0242  WBC 10.4 15.0* 12.3* 7.3 8.2  NEUTROABS 8.2*  --   --   --   --   HGB 14.5 15.9* 15.6* 14.4 15.0  HCT 42.6 46.7* 45.3 42.4 43.0  MCV 92.8 92.7 92.3 94.2 91.7  PLT 261 309 286 263 278   Cardiac Enzymes: No results for input(s): CKTOTAL, CKMB, CKMBINDEX, TROPONINI in the last 168 hours. BNP: Invalid input(s): POCBNP CBG: No results for input(s): GLUCAP in the last 168 hours. D-Dimer No results for input(s): DDIMER in the last 72 hours. Hgb A1c No results for input(s): HGBA1C in the last 72 hours. Lipid Profile No results for input(s): CHOL, HDL, LDLCALC, TRIG, CHOLHDL, LDLDIRECT in the last 72 hours. Thyroid function studies No results for input(s): TSH, T4TOTAL, T3FREE, THYROIDAB in the last 72 hours.  Invalid input(s): FREET3 Anemia work up No results for input(s): VITAMINB12, FOLATE, FERRITIN, TIBC, IRON, RETICCTPCT in the last 72 hours. Microbiology Recent Results (from the past 240 hour(s))  Resp Panel by RT-PCR (Flu A&B, Covid) Nasopharyngeal Swab     Status: None   Collection Time: 08/06/20  5:15 AM   Specimen: Nasopharyngeal Swab; Nasopharyngeal(NP) swabs in vial transport medium  Result Value Ref Range Status   SARS Coronavirus 2 by RT PCR NEGATIVE NEGATIVE Final    Comment: (NOTE) SARS-CoV-2 target nucleic acids are NOT DETECTED.  The SARS-CoV-2 RNA is generally detectable in upper respiratory specimens during the acute phase of infection. The lowest concentration of SARS-CoV-2 viral copies this assay can detect is 138 copies/mL. A negative result does not preclude SARS-Cov-2 infection and should not be used as the sole basis for treatment or other patient management decisions. A negative result may occur with  improper specimen collection/handling, submission of specimen other than nasopharyngeal swab, presence of viral mutation(s) within the areas targeted by this assay, and inadequate number of  viral copies(<138 copies/mL). A negative result must be combined with clinical observations, patient history, and epidemiological information. The expected result is Negative.  Fact Sheet for Patients:  EntrepreneurPulse.com.au  Fact Sheet for Healthcare Providers:  IncredibleEmployment.be  This test is no t yet approved or cleared by the [place] FDA and  has been authorized for detection and/or diagnosis of SARS-CoV-2 by FDA under an Emergency Use Authorization (EUA). This EUA will remain  in effect (meaning this test can be used) for the duration of the COVID-19 declaration under Section 564(b)(1) of the Act, 21 U.S.C.section 360bbb-3(b)(1), unless the authorization is terminated  or revoked sooner.       Influenza A by PCR NEGATIVE NEGATIVE Final   Influenza B by PCR NEGATIVE NEGATIVE Final    Comment: (NOTE) The Xpert Xpress SARS-CoV-2/FLU/RSV plus assay is intended as an aid in the diagnosis of influenza from Nasopharyngeal swab specimens and should not be used as a sole basis for treatment. Nasal washings and aspirates are unacceptable for Xpert Xpress SARS-CoV-2/FLU/RSV testing.  Fact Sheet for Patients: EntrepreneurPulse.com.au  Fact Sheet for Healthcare Providers: IncredibleEmployment.be  This test is not yet approved or cleared by the [place] FDA and has been authorized for detection and/or diagnosis of SARS-CoV-2 by FDA under an Emergency Use Authorization (EUA). This EUA will remain in effect (meaning this test can be used) for the duration of the COVID-19 declaration under Section 564(b)(1) of the Act, 21 U.S.C. section 360bbb-3(b)(1), unless the authorization is terminated or revoked.  Performed at [facility], [place] [address], [place], [place] [address]      Discharge Instructions:   Discharge Instructions    Call MD for:  persistant nausea and vomiting    Complete by: As directed    Call MD for:  severe uncontrolled pain   Complete by: As directed    Call MD for:  temperature >100.4   Complete by: As directed    Diet - low sodium heart healthy   Complete by: As directed    Diet general   Complete by: As directed    Discharge instructions   Complete by: As directed    Follow-up with GI, Dr Benson Norway in 2 weeks.  Continue to take medications as prescribed.   Increase activity slowly   Complete by: As directed    Increase activity slowly   Complete by: As directed      Allergies as of 08/12/2020   No Known Allergies     Medication List    STOP taking these medications   ibuprofen 200 MG tablet Commonly known as: ADVIL     TAKE these medications   acetaminophen 325 MG tablet Commonly known as: TYLENOL Take 1 tablet (325 mg total) by mouth every 6 (six) hours as needed for mild pain or headache.   HYDROcodone-acetaminophen 5-325 MG tablet Commonly known as: NORCO/VICODIN Take 1 tablet by mouth every 6 (six) hours as needed for moderate pain.   nicotine 14 mg/24hr patch Commonly known as: NICODERM CQ - dosed in mg/24 hours Place 1 patch (14 mg total) onto the skin daily.   ondansetron 4 MG tablet Commonly known as: ZOFRAN Take 1 tablet (4 mg total) by mouth every 6 (six) hours as needed for nausea.   pantoprazole 40 MG tablet Commonly known as: Protonix Take 1 tablet (40 mg total) by mouth daily.   predniSONE 10 MG tablet Commonly known as: DELTASONE Take 4 tablets (40 mg total) by mouth daily.       Follow-up Information    Carol Ada, MD. Schedule an appointment as soon as possible for a visit in 2 week(s).   Specialty: Gastroenterology Contact information: [address] [place]  [address] [phone]                Time coordinating discharge: 39 minutes  Signed:  Zephaniah Enyeart  [facility] 08/12/2020, 9:31 AM

## 2020-08-14 ENCOUNTER — Encounter (HOSPITAL_COMMUNITY): Payer: Self-pay | Admitting: Gastroenterology

## 2020-08-24 ENCOUNTER — Other Ambulatory Visit: Payer: Self-pay | Admitting: Gastroenterology

## 2020-08-24 DIAGNOSIS — R1011 Right upper quadrant pain: Secondary | ICD-10-CM

## 2020-08-24 DIAGNOSIS — R9389 Abnormal findings on diagnostic imaging of other specified body structures: Secondary | ICD-10-CM

## 2020-09-14 ENCOUNTER — Other Ambulatory Visit: Payer: Medicaid Other

## 2020-09-25 ENCOUNTER — Other Ambulatory Visit: Payer: Medicaid Other

## 2020-10-09 ENCOUNTER — Ambulatory Visit
Admission: RE | Admit: 2020-10-09 | Discharge: 2020-10-09 | Disposition: A | Discharge status: Home / Self Care | Payer: Medicaid Other | Source: Ambulatory Visit | Attending: Gastroenterology | Admitting: Gastroenterology

## 2020-10-09 DIAGNOSIS — R1011 Right upper quadrant pain: Secondary | ICD-10-CM

## 2020-10-09 DIAGNOSIS — R9389 Abnormal findings on diagnostic imaging of other specified body structures: Secondary | ICD-10-CM

## 2020-10-09 MED ORDER — IOPAMIDOL (ISOVUE-300) INJECTION 61%
100.0000 mL | Freq: Once | INTRAVENOUS | Status: AC | PRN
  Administered 2020-10-09: 100 mL via INTRAVENOUS

## 2022-10-24 IMAGING — CT CT ENTEROGRAPHY (ABD-PELV W/ CM)
2 of 6 series · 16 of 46 positions shown, 18 images · IV contrast (iopamidol)
Comparison: CT scan 08/06/2020

CLINICAL DATA: Right-sided abdominal pain. Prior abnormal CT scan
suggesting inflammatory bowel disease.

EXAM:
CT ABDOMEN AND PELVIS WITH CONTRAST (ENTEROGRAPHY)
TECHNIQUE: Multidetector CT of the abdomen and pelvis during bolus
administration of intravenous contrast. Negative oral contrast was
given.
CONTRAST:  100mL DMUTBS-PNN IOPAMIDOL (DMUTBS-PNN) INJECTION 61%

[Series 4: enterography 2.00 br40 s3 cor · coronal · 0.75mm/px · 3 of 131 slices shown]
[im 58/131  soft-tissue]
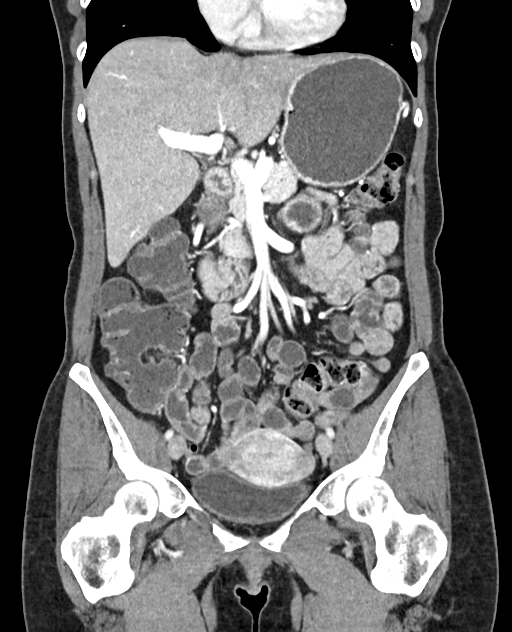
[im 73/131  soft-tissue]
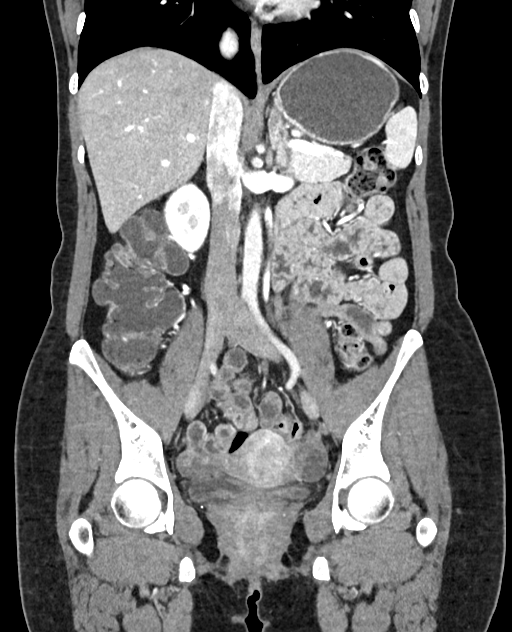
[im 87/131  soft-tissue]
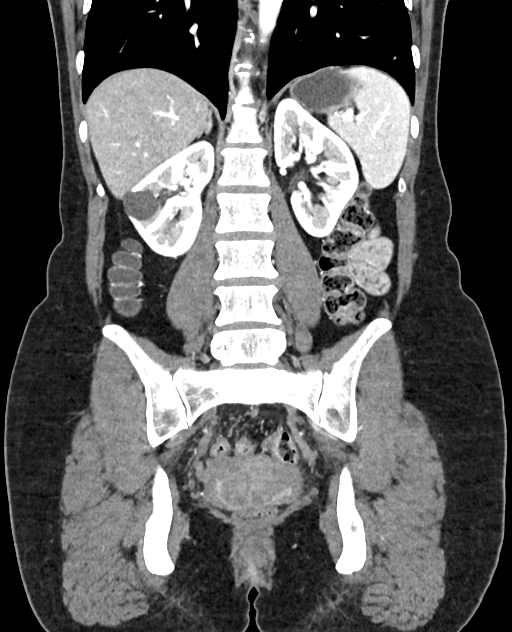

[Series 9: enterography 3.00 br40 s3 axial 3mm · axial · 0.78mm/px · z∈[+1180,+1585]mm · 13 of 157 slices shown, 15 images]
[im 11/157  soft-tissue]
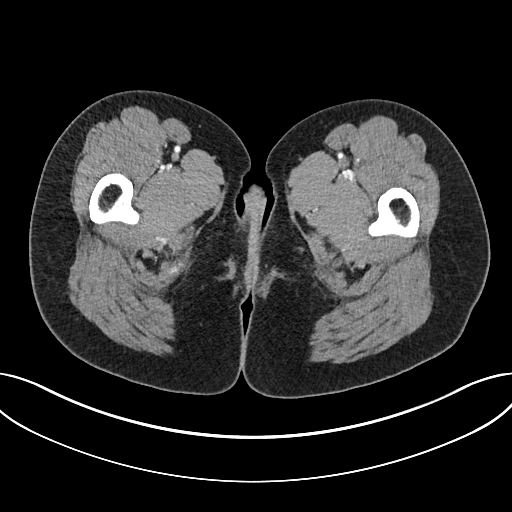
[im 11/157  bone]
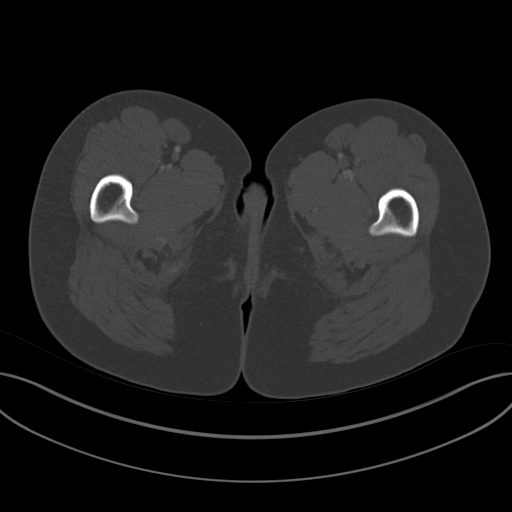
[im 21/157  soft-tissue]
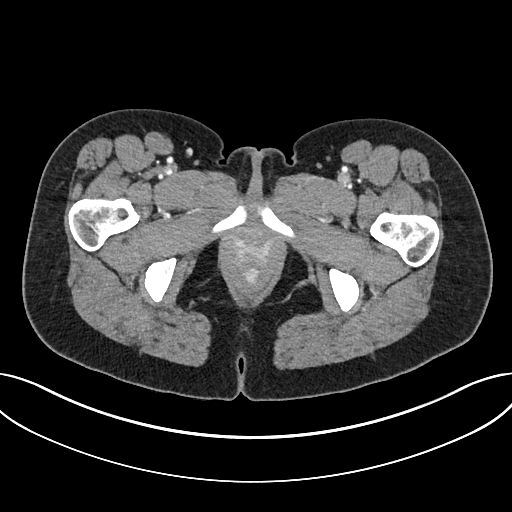
[im 32/157  soft-tissue]
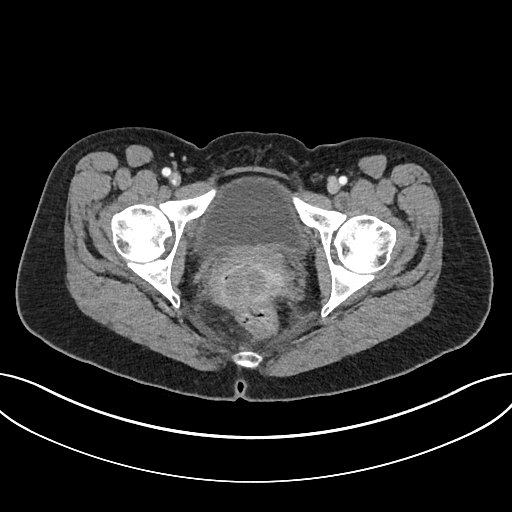
[im 42/157  soft-tissue]
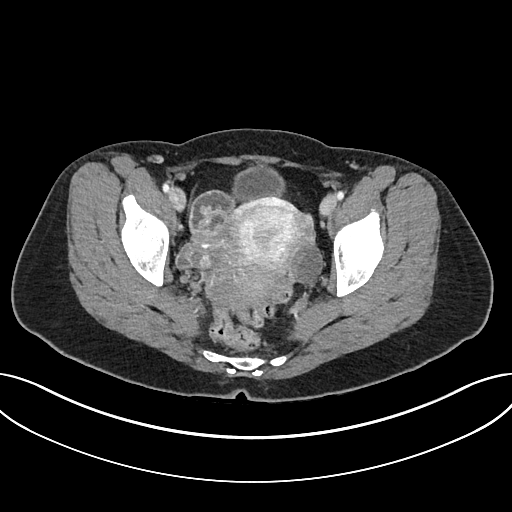
[im 53/157  soft-tissue]
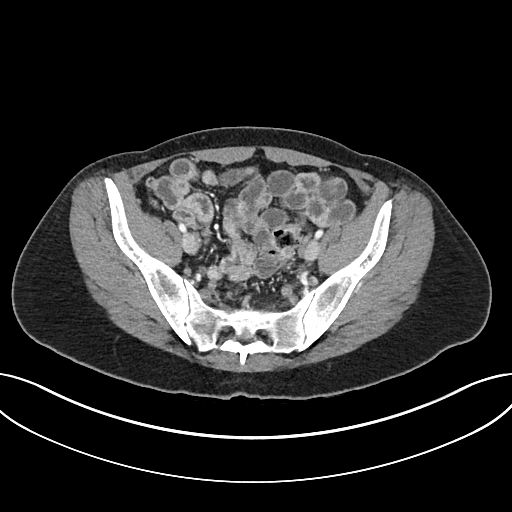
[im 63/157  soft-tissue]
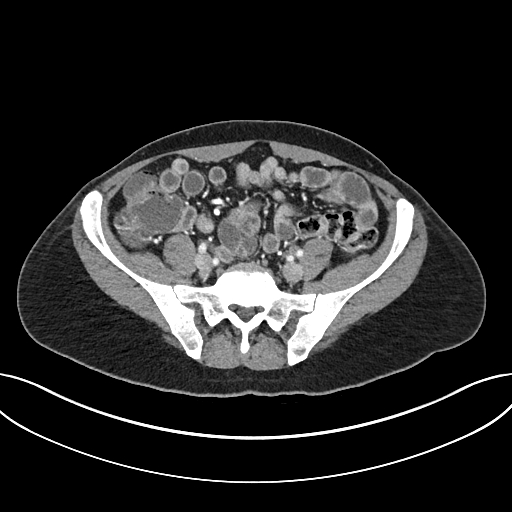
[im 84/157  soft-tissue]
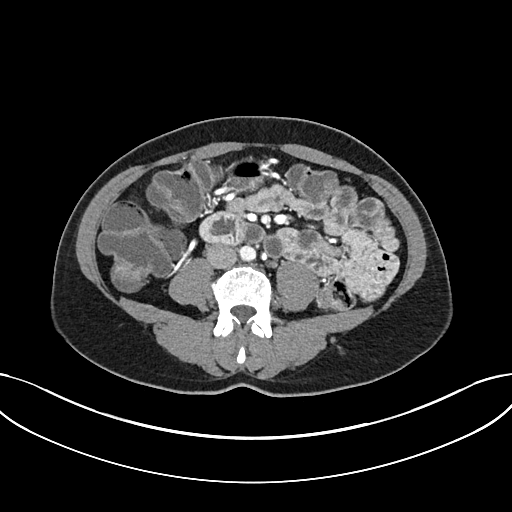
[im 94/157  soft-tissue]
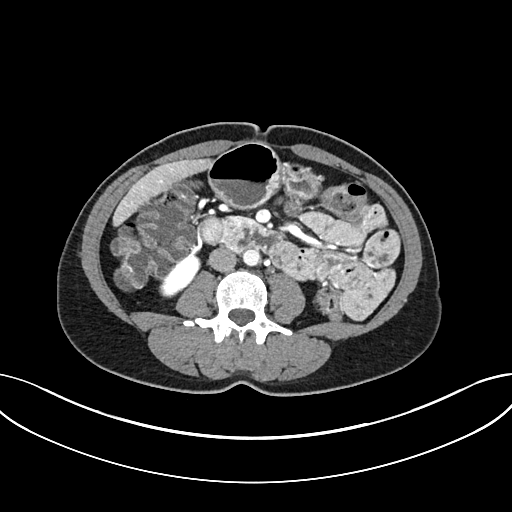
[im 105/157  soft-tissue]
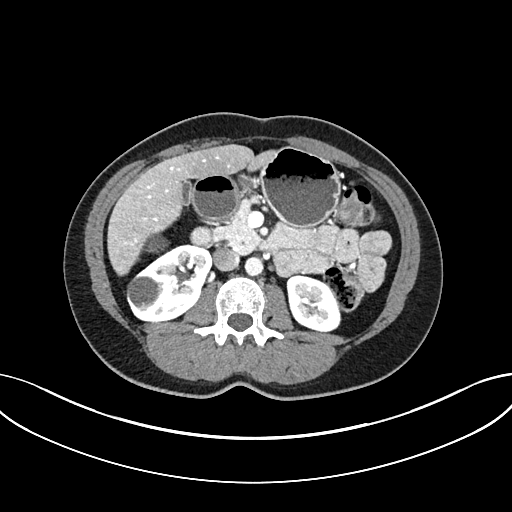
[im 105/157  bone]
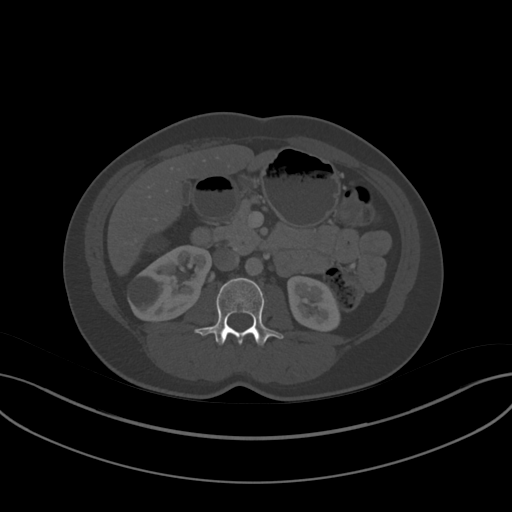
[im 115/157  soft-tissue]
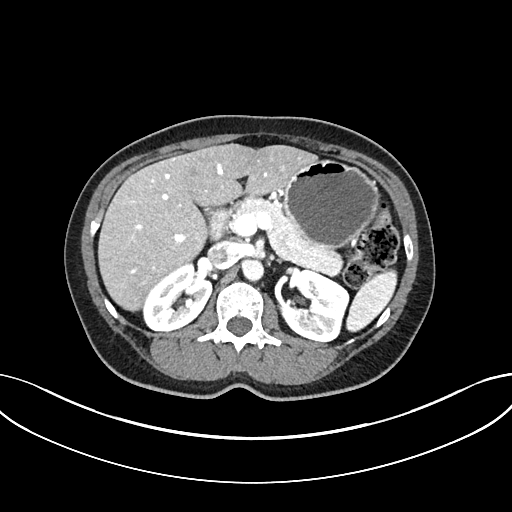
[im 125/157  soft-tissue]
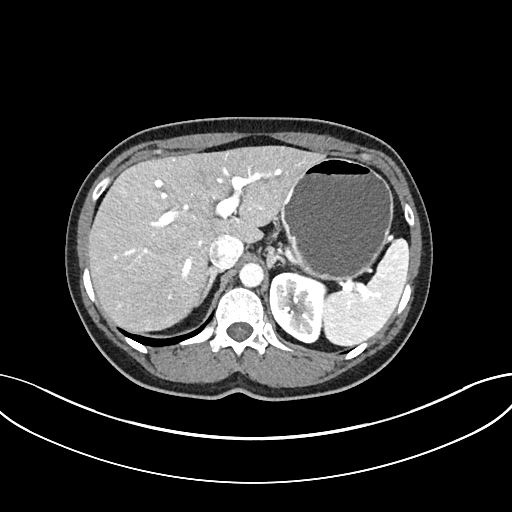
[im 136/157  soft-tissue]
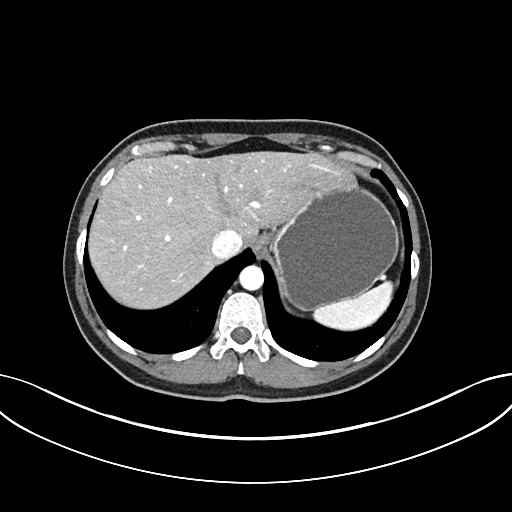
[im 146/157  soft-tissue]
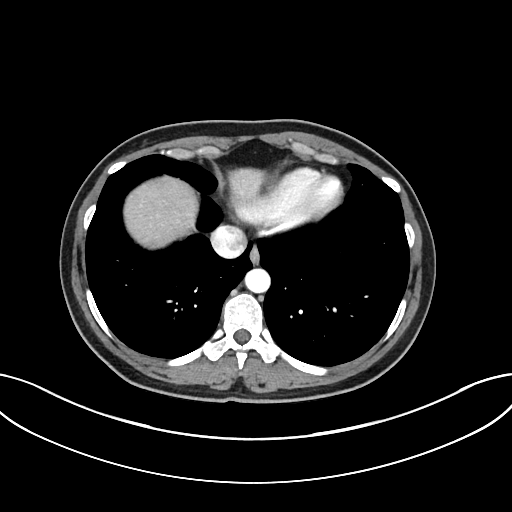

[16 of 46 positions shown; findings below may reference images not displayed]

FINDINGS: Lower chest: The lung bases are clear of acute process. No pleural
effusion or pulmonary lesions. The heart is normal in size. No
pericardial effusion. The distal esophagus and aorta are
unremarkable.

Hepatobiliary: Stable low-attenuation lesions in the right hepatic
lobe which appear to partially fill in on the delayed images and are
likely benign hemangiomas. No worrisome hepatic lesions or
intrahepatic biliary dilatation. The gallbladder is unremarkable. No
common bile duct dilatation.

Pancreas: No mass, inflammation or ductal dilatation.

Spleen: Normal size. No focal lesions.

Adrenals/Urinary Tract: Stable small left adrenal gland nodule,
likely benign adenoma. The right adrenal gland is normal.

Stable right renal cyst. No worrisome renal lesions. No collecting
system abnormalities.

The bladder is unremarkable.

Stomach/Bowel: The stomach, duodenum, small and colon unremarkable.
No acute inflammatory process, mass lesions or obstructive findings.
Specifically, I do not see any inflammatory process involving the
terminal ileum to suggest active Crohn's disease. The appendix is
normal.

Vascular/Lymphatic: The aorta is normal in caliber. No dissection.
The branch vessels are patent. The major venous structures are
patent. No mesenteric or retroperitoneal mass or adenopathy. Small
scattered lymph nodes are noted.

Reproductive: The uterus and ovaries are unremarkable.

Other: No pelvic mass or adenopathy. No free pelvic fluid
collections. No inguinal mass or adenopathy. No abdominal wall
hernia or subcutaneous lesions.

Musculoskeletal: No significant bony findings.
IMPRESSION: 1. No acute abdominal/pelvic findings, mass lesions or adenopathy.
2. No CT findings for active Crohn's disease.
3. Stable right hepatic lobe lesions, likely benign hemangiomas.
4. Stable small left adrenal gland nodule, likely benign adenoma.
